# Patient Record
Sex: Male | Born: 1945 | Race: White | Hispanic: No | Marital: Married | Smoking: Former smoker
Health system: Southern US, Community
[De-identification: ages and names within clinical notes are randomized; demographics above are authoritative.]

## PROBLEM LIST (undated history)

## (undated) DIAGNOSIS — F329 Major depressive disorder, single episode, unspecified: Secondary | ICD-10-CM

## (undated) DIAGNOSIS — N400 Enlarged prostate without lower urinary tract symptoms: Secondary | ICD-10-CM

## (undated) DIAGNOSIS — M1990 Unspecified osteoarthritis, unspecified site: Secondary | ICD-10-CM

## (undated) DIAGNOSIS — E119 Type 2 diabetes mellitus without complications: Secondary | ICD-10-CM

## (undated) DIAGNOSIS — I10 Essential (primary) hypertension: Secondary | ICD-10-CM

## (undated) DIAGNOSIS — E785 Hyperlipidemia, unspecified: Secondary | ICD-10-CM

## (undated) DIAGNOSIS — G4733 Obstructive sleep apnea (adult) (pediatric): Secondary | ICD-10-CM

## (undated) DIAGNOSIS — I509 Heart failure, unspecified: Secondary | ICD-10-CM

## (undated) DIAGNOSIS — F419 Anxiety disorder, unspecified: Secondary | ICD-10-CM

## (undated) HISTORY — DX: Unspecified osteoarthritis, unspecified site: M19.90

## (undated) HISTORY — DX: Obstructive sleep apnea (adult) (pediatric): G47.33

## (undated) HISTORY — DX: Essential (primary) hypertension: I10

## (undated) HISTORY — DX: Heart failure, unspecified: I50.9

## (undated) HISTORY — PX: OTHER SURGICAL HISTORY: SHX169

## (undated) HISTORY — DX: Major depressive disorder, single episode, unspecified: F32.9

## (undated) HISTORY — DX: Anxiety disorder, unspecified: F41.9

## (undated) HISTORY — DX: Benign prostatic hyperplasia without lower urinary tract symptoms: N40.0

## (undated) HISTORY — DX: Hyperlipidemia, unspecified: E78.5

---

## 1993-03-23 HISTORY — PX: FOOT SURGERY: SHX648

## 1999-01-01 ENCOUNTER — Ambulatory Visit (HOSPITAL_COMMUNITY): Admission: RE | Admit: 1999-01-01 | Discharge: 1999-01-01 | Payer: Self-pay | Admitting: *Deleted

## 2000-03-12 ENCOUNTER — Ambulatory Visit (HOSPITAL_COMMUNITY): Admission: RE | Admit: 2000-03-12 | Discharge: 2000-03-12 | Payer: Self-pay | Admitting: Specialist

## 2000-03-12 ENCOUNTER — Encounter: Payer: Self-pay | Admitting: Specialist

## 2000-03-23 HISTORY — PX: SPINE SURGERY: SHX786

## 2000-03-23 HISTORY — PX: OTHER SURGICAL HISTORY: SHX169

## 2000-03-26 ENCOUNTER — Ambulatory Visit (HOSPITAL_COMMUNITY): Admission: RE | Admit: 2000-03-26 | Discharge: 2000-03-26 | Payer: Self-pay | Admitting: Specialist

## 2000-03-26 ENCOUNTER — Encounter: Payer: Self-pay | Admitting: Specialist

## 2000-05-05 ENCOUNTER — Encounter: Payer: Self-pay | Admitting: Specialist

## 2000-05-12 ENCOUNTER — Inpatient Hospital Stay (HOSPITAL_COMMUNITY): Admission: RE | Admit: 2000-05-12 | Discharge: 2000-05-15 | Payer: Self-pay | Admitting: Specialist

## 2000-05-12 ENCOUNTER — Encounter (INDEPENDENT_AMBULATORY_CARE_PROVIDER_SITE_OTHER): Payer: Self-pay | Admitting: Specialist

## 2000-05-12 ENCOUNTER — Encounter: Payer: Self-pay | Admitting: Specialist

## 2000-12-29 ENCOUNTER — Emergency Department (HOSPITAL_COMMUNITY): Admission: EM | Admit: 2000-12-29 | Discharge: 2000-12-29 | Payer: Self-pay | Admitting: Emergency Medicine

## 2000-12-29 ENCOUNTER — Encounter: Payer: Self-pay | Admitting: Emergency Medicine

## 2001-01-11 ENCOUNTER — Ambulatory Visit (HOSPITAL_BASED_OUTPATIENT_CLINIC_OR_DEPARTMENT_OTHER): Admission: RE | Admit: 2001-01-11 | Discharge: 2001-01-11 | Payer: Self-pay | Admitting: Orthopaedic Surgery

## 2001-03-21 ENCOUNTER — Encounter: Admission: RE | Admit: 2001-03-21 | Discharge: 2001-03-21 | Payer: Self-pay | Admitting: Specialist

## 2001-03-21 ENCOUNTER — Encounter: Payer: Self-pay | Admitting: Specialist

## 2001-05-11 ENCOUNTER — Encounter: Admission: RE | Admit: 2001-05-11 | Discharge: 2001-05-11 | Payer: Self-pay | Admitting: Urology

## 2001-05-11 ENCOUNTER — Encounter: Payer: Self-pay | Admitting: Orthopaedic Surgery

## 2001-05-13 ENCOUNTER — Ambulatory Visit (HOSPITAL_COMMUNITY): Admission: RE | Admit: 2001-05-13 | Discharge: 2001-05-13 | Payer: Self-pay | Admitting: *Deleted

## 2001-06-24 ENCOUNTER — Encounter: Payer: Self-pay | Admitting: Orthopaedic Surgery

## 2001-06-29 ENCOUNTER — Encounter: Payer: Self-pay | Admitting: Orthopaedic Surgery

## 2001-06-29 ENCOUNTER — Inpatient Hospital Stay (HOSPITAL_COMMUNITY): Admission: RE | Admit: 2001-06-29 | Discharge: 2001-07-04 | Payer: Self-pay | Admitting: Orthopaedic Surgery

## 2001-07-04 ENCOUNTER — Encounter: Payer: Self-pay | Admitting: Orthopaedic Surgery

## 2001-08-10 ENCOUNTER — Ambulatory Visit (HOSPITAL_COMMUNITY): Admission: RE | Admit: 2001-08-10 | Discharge: 2001-08-10 | Payer: Self-pay | Admitting: *Deleted

## 2003-03-24 DIAGNOSIS — F32A Depression, unspecified: Secondary | ICD-10-CM

## 2003-03-24 HISTORY — DX: Depression, unspecified: F32.A

## 2005-03-18 ENCOUNTER — Ambulatory Visit: Payer: Self-pay | Admitting: Internal Medicine

## 2005-04-03 ENCOUNTER — Ambulatory Visit (HOSPITAL_BASED_OUTPATIENT_CLINIC_OR_DEPARTMENT_OTHER): Admission: RE | Admit: 2005-04-03 | Discharge: 2005-04-03 | Payer: Self-pay | Admitting: Family Medicine

## 2005-04-12 ENCOUNTER — Ambulatory Visit: Payer: Self-pay | Admitting: Internal Medicine

## 2005-05-29 ENCOUNTER — Ambulatory Visit (HOSPITAL_BASED_OUTPATIENT_CLINIC_OR_DEPARTMENT_OTHER): Admission: RE | Admit: 2005-05-29 | Discharge: 2005-05-29 | Payer: Self-pay | Admitting: Family Medicine

## 2005-05-31 ENCOUNTER — Ambulatory Visit: Payer: Self-pay | Admitting: Internal Medicine

## 2006-11-09 ENCOUNTER — Ambulatory Visit: Payer: Self-pay | Admitting: Internal Medicine

## 2007-02-23 ENCOUNTER — Inpatient Hospital Stay (HOSPITAL_COMMUNITY): Admission: RE | Admit: 2007-02-23 | Discharge: 2007-02-27 | Payer: Self-pay | Admitting: Specialist

## 2008-06-25 ENCOUNTER — Ambulatory Visit: Payer: Self-pay | Admitting: Diagnostic Radiology

## 2008-06-25 ENCOUNTER — Inpatient Hospital Stay (HOSPITAL_COMMUNITY): Admission: EM | Admit: 2008-06-25 | Discharge: 2008-06-28 | Payer: Self-pay | Admitting: Internal Medicine

## 2008-06-25 ENCOUNTER — Encounter: Payer: Self-pay | Admitting: Emergency Medicine

## 2010-07-02 LAB — COMPREHENSIVE METABOLIC PANEL
AST: 24 U/L (ref 0–37)
Albumin: 3.6 g/dL (ref 3.5–5.2)
Chloride: 103 mEq/L (ref 96–112)
Creatinine, Ser: 1.1 mg/dL (ref 0.4–1.5)
GFR calc Af Amer: >60 mL/min (ref >60)
Potassium: 3.9 mEq/L (ref 3.5–5.1)
Total Bilirubin: 0.5 mg/dL (ref 0.3–1.2)
Total Protein: 7.2 g/dL (ref 6.0–8.3)

## 2010-07-02 LAB — POCT CARDIAC MARKERS
CKMB, poc: <1 ng/mL — ABNORMAL LOW (ref 1.0–8.0)
CKMB, poc: <1 ng/mL — ABNORMAL LOW (ref 1.0–8.0)
Myoglobin, poc: 75.4 ng/mL (ref 12–200)
Myoglobin, poc: 87.5 ng/mL (ref 12–200)
Troponin i, poc: <0.05 ng/mL (ref 0.00–0.09)

## 2010-07-02 LAB — CBC
Hemoglobin: 12.3 g/dL — ABNORMAL LOW (ref 13.0–17.0)
MCHC: 34 g/dL (ref 30.0–36.0)
MCV: 91.6 fL (ref 78.0–100.0)
Platelets: 122 10*3/uL — ABNORMAL LOW (ref 150–400)
RBC: 3.9 MIL/uL — ABNORMAL LOW (ref 4.22–5.81)
RDW: 14.4 % (ref 11.5–15.5)
RDW: 13.5 % (ref 11.5–15.5)
WBC: 7 10*3/uL (ref 4.0–10.5)
WBC: 5.7 10*3/uL (ref 4.0–10.5)

## 2010-07-02 LAB — CULTURE, BLOOD (ROUTINE X 2): Culture: NO GROWTH

## 2010-07-02 LAB — URINALYSIS, ROUTINE W REFLEX MICROSCOPIC
Glucose, UA: NEGATIVE mg/dL
pH: 6 (ref 5.0–8.0)

## 2010-07-02 LAB — POCT I-STAT 3, ART BLOOD GAS (G3+)
Bicarbonate: 25 mEq/L — ABNORMAL HIGH (ref 20.0–24.0)
O2 Saturation: 94 %
Patient temperature: 37
TCO2: 26 mmol/L (ref 0–100)
pH, Arterial: 7.398 (ref 7.350–7.450)

## 2010-07-02 LAB — LIPID PANEL
HDL: 35 mg/dL — ABNORMAL LOW (ref >39)
Triglycerides: 35 mg/dL (ref <150)
VLDL: 7 mg/dL (ref 0–40)

## 2010-07-02 LAB — BASIC METABOLIC PANEL
CO2: 27 mEq/L (ref 19–32)
Calcium: 8.3 mg/dL — ABNORMAL LOW (ref 8.4–10.5)
Calcium: 8.9 mg/dL (ref 8.4–10.5)
Chloride: 105 mEq/L (ref 96–112)
Creatinine, Ser: 1.17 mg/dL (ref 0.4–1.5)
GFR calc Af Amer: >60 mL/min (ref >60)
GFR calc non Af Amer: >60 mL/min (ref >60)
Glucose, Bld: 173 mg/dL — ABNORMAL HIGH (ref 70–99)
Sodium: 143 mEq/L (ref 135–145)

## 2010-07-02 LAB — DIFFERENTIAL
Basophils Absolute: 0 10*3/uL (ref 0.0–0.1)
Eosinophils Relative: 3 % (ref 0–5)
Lymphocytes Relative: 19 % (ref 12–46)
Monocytes Absolute: 0.8 10*3/uL (ref 0.1–1.0)
Monocytes Relative: 15 % — ABNORMAL HIGH (ref 3–12)

## 2010-07-02 LAB — URINE MICROSCOPIC-ADD ON

## 2010-07-02 LAB — PROTIME-INR: INR: 1.1 (ref 0.00–1.49)

## 2010-08-05 NOTE — Op Note (Signed)
NAMEBRANTLY, KALMAN               ACCOUNT NO.:  [id number]   MEDICAL RECORD NO.:  [id number]          PATIENT TYPE:  AMB   LOCATION:  SDS                          FACILITY:  [facility]   PHYSICIAN:  Kerrin Champagne, M.D.   DATE OF BIRTH:  [birth year]   DATE OF PROCEDURE:  02/23/2007  DATE OF DISCHARGE:                               OPERATIVE REPORT   PREOPERATIVE DIAGNOSIS:  Left foot dorsal abscess.   POSTOPERATIVE DIAGNOSIS:  Left foot dorsal abscess with surrounding  cellulitis.   PROCEDURE:  Incision, drainage and debridement, left dorsal foot  abscess.   SURGEON:  Kerrin Champagne, M.D.   ASSISTANT:  None.   ANESTHESIA:  General via orotracheal intubation, Kaylyn Layer. Michelle Piper, M.D.   FINDINGS:  Left foot dorsal abscess superficial to the tendon sheath  with areas of fat necrosis. The total diameter about 6 to 7 cm.   SPECIMENS:  Anaerobic and aerobic culture and sensitivity obtained.   ESTIMATED BLOOD LOSS:  15 to 20 mL.   COMPLICATIONS:  None.   The patient returned to the PACU in good condition.   HISTORY OF PRESENT ILLNESS:  A 65 year old male who has been  experiencing worsening swelling and discomfort over the left dorsal foot  over the past 3 days.  He is a Naval architect newly diagnosed with  diabetes by his primary care physician at [facility].  He was seen  at urgent care on Monday in [facility] and there the patient had  cultures performed, the results of which are not available.  He  underwent a very small incision and drainage of the dorsal aspect of his  foot, had worsening swelling and discomfort, presented to our office  today, February 23, 2007 and was found to have severely swollen  extremity, left dorsal foot, with areas of abscess over the dorsal  aspect of the foot and surrounding cellulitis.  He is brought to the  operating room to undergo a formal incision, drainage and debridement.   DESCRIPTION OF PROCEDURE:  After adequate general anesthesia, the  left  lower extremity was prepped with DuraPrep solution following removal of  wick from area from area of the abscess over the dorsal aspect, left  foot.  The prep was performed up to the mid calf level, draped in the  usual manner.  Initial incision measuring about 1 cm in length was  extended distally an additional 2 to 3 cm and then proximally an  additional 2 cm and this area was then opened.  Soft tissues were then  expressed and a great deal of abundant necrotic fat tissue was  expressed.  Cultures were obtained of the abscessed area.  Curettage was  then carried out using medium sized curets, debriding areas of dead or  necrotic subcutaneous layers.  The patient's extensor mechanism appeared  to be in good condition.  The abscess appeared to be superficial to the  extensor tendon sheath.  After adequate curettage, then irrigation was  carried out using 1,000 mL of normal saline, 1,000 mL of double  antibiotic solution after placing about 250 mL  of hydrogen peroxide  through the wound and then irrigating out with further normal saline.  Packing was then carried out using plain gauze packing spread out to a  length of about an inch.  Adaptic, 4 x 4's, ABD pad then applied, fixed  to the skin with Kerlix and then Coban applied.  Fascia was then  reactivated, extubated, returned to the recovery room in satisfactory  condition.  All instrument and sponge counts were correct.  Note that  the area of drainage was primarily over the dorsal aspect of the  forefoot at about the mid portion of the metatarsal, extending to the  tarsometatarsal junction.      Kerrin Champagne, M.D.  Electronically Signed     JEN/MEDQ  D:  02/23/2007  T:  02/23/2007  Job:  [id number]

## 2010-08-05 NOTE — Assessment & Plan Note (Signed)
[facility]                             PULMONARY OFFICE NOTE   [name], [name]                      MRN:          [id number]  DATE:11/09/2006                            DOB:          1945/10/21    SLEEP MEDICINE CONSULTATION   PROBLEM:  This is a 65 year old man with a long-standing history of  sleep apnea who comes now for a CPAP adjustment.   HISTORY:  Sleep apnea was diagnosed years ago.  Nasal congestion is a  persistent problem that interferes with CPAP comfort.  His nose is okay  when he is up in the daytime off of CPAP.  The CPAP pressure appears  comfortable set around 14 CWP through [facility].  We have NPSG report from  April 03, 2005 indicating an AHI of 50.3 per hour and a CPAP titration  study on May 29, 2005 to a CPAP pressure of 14 CWP for an AHI of 6.7  per hour.  He says he hacks, coughs, and sneezes when he gets up.  He is  told he occasionally snores through his mask, and that his legs twitch  some in his sleep.  Bed time is around 10 p.m. estimating 15 minute  sleep latency and waking 3 to 4 times before up at 6:30 a.m.   MEDICATIONS:  1. Aspirin 81 mg.  2. Doxazosin 8 mg.  3. Potassium 10 mg.  4. Nabumetone 750 mg x2 b.i.d.  5. Furosemide 40 mg.  6. Lipitor 20 mg.  7. CPAP.  He had had oxygen for sleep.  8. Symbix for depression.   No medication allergy.   REVIEW OF SYSTEMS:  Snoring.  Daytime sleepiness if inactive.  Leg and  body jerks while sleeping.  Dozing off in the evening.  Depression.   PAST HISTORY:  Seasonal rhinitis with no ENT surgery.  No lung disease.  Hypertension with no heart disease.  Sleep apnea.  Previous spine  surgery.   SOCIAL HISTORY:  Never smoked.  Quit drinking 20 years ago.  Married.  No children.  Married.  He is a Naval architect.  He is adopted.   FAMILY HISTORY:  Unknown because of adoption.   OBJECTIVE:  Weight 356 pounds, BP 130/78, pulse 56, room air saturation  98%.  This  is an obese man, alert.  NEUROLOGIC:  Unremarkable to observation.  HEENT:  Palate spacing 3/4 with short, thick neck.  No thyromegaly or  stridor.  Neck veins are not prominent.  Turbinates are edematous.  There is significant bilateral periorbital edema.  HEART:  Sounds are regular without murmur.  LUNGS:  The lung fields are clear.  EXTREMITIES:  Without edema or cyanosis.   IMPRESSION:  1. Obstructive sleep apnea recently titrated to suggested pressure of      14 cm water pressure compared with previous unknown pressure.  2. Rhinitis.  3. Some leg jerks at night, which his wife regards as a minor problem.   PLAN:  1. CPAP at 14 CWP.  2. Sample Patanase nasal spray once each nostril b.i.d. p.r.n.  3. Sleep hygiene, weight loss,  and safe driving.  4. [employer] is going to work on his mask fit, which is currently his      biggest complaint.  5. Schedule return in 1 month, earlier p.r.n.     Clinton D. Maple Hudson, MD, Tonny Bollman, FACP  Electronically Signed    CDY/MedQ  DD: 11/09/2006  DT: 11/10/2006  Job #: [id number]   cc:   Tanya D. Daphine Deutscher, M.D.

## 2010-08-05 NOTE — Discharge Summary (Signed)
[name], [name]               ACCOUNT NO.:  [id number]   MEDICAL RECORD NO.:  [id number]          PATIENT TYPE:  INP   LOCATION:  3027                         FACILITY:  [facility]   PHYSICIAN:  Gardiner Barefoot, MD    DATE OF BIRTH:  09-May-1945   DATE OF ADMISSION:  06/25/2008  DATE OF DISCHARGE:                               DISCHARGE SUMMARY   PRIMARY CARE PHYSICIAN:  Tanya D. Daphine Deutscher, MD   HISTORY OF PRESENT ILLNESS:  The patient is a 65 year old male with a  history of obstructive sleep apnea and long smoking history presented  with what was described as a nonproductive cough and shortness of breath  for 5 days prior to the admission.  The patient was initially evaluated  for PE and was noted to not have PE, but left lower lobe pneumonia on a  chest x-ray and a CT angio.  He was subsequently admitted for IV therapy  and started on community-acquired therapy.   DISCHARGE DIAGNOSES:  1. Community-acquired pneumonia, improving.  2. Benign prostatic hypertrophy.  3. Obstructive sleep apnea on CPAP.  4. Spondylosis with history of multiple laminectomies and on chronic      pain medications.  5. Morbid obesity.  6. History of tobacco abuse.  7. History of alcohol abuse.  8. Hyperlipidemia.  9. Hypertension.  10.Unclear of history of congestive heart failure, however, BNP was      within normal limits.   MEDICATIONS AT DISCHARGE:  1. Levaquin 750 mg p.o. daily for 3 more days through June 29, 2008.  2. Doxazosin 8 mg p.o. b.i.d.  3. Lasix 40 mg p.o. b.i.d.  4. Lipitor 20 mg p.o. daily.  5. Potassium 10 mEq p.o. daily.  6. Spironolactone 50 mg p.o. daily.  7. Multivitamin daily.  8. Nabumetone 750 mg p.o. b.i.d.   HOSPITAL COURSE:  1. Community-acquired pneumonia.  The patient was started on therapy      with ceftriaxone and azithromycin for presumed community-acquired      pneumonia.  After talking to the patient, it was determined to be a      very productive cough with a  clear white sputum, consistent with      pneumonia.  The patient did not have any fever, however, and his      white count remained normal.  Also the patient initially had      reported some shortness of breath; however, this was resolved at      discharge.  The patient was on oxygen therapy during his      hospitalization, however, had good O2 sats on room air prior to      discharge.  The patient was instructed to call his doctor or return      to emergency room if he develops any fever or worsening, any new      shortness of breath that is not consistent with his baseline with      his obstructive sleep apnea.  The patient felt he was stable to go      home and was at his baseline other than  the continued productive      cough.  The patient was eager to home and was felt stable at      discharge with no hypoxia or any acute symptoms requiring      hospitalization.  The patient will continue with Levaquin at a high      dose of 750 mg to complete 3 more days of therapy after discharge      for his community-acquired pneumonia.  He is also instructed to see      his primary care physician by the end of this week to ensure that      he is improving.  2. Obstructive sleep apnea.  The patient was continued on BiPAP.  3. CHF.  There is some report of CHF in history, however his BNP was      within normal limits, there is no echo known to have been done in      the past.  He is, however, on Lasix therefore may benefit      outpatient echocardiogram to evaluate his cardiac function, if it      has not been done previously.  4. Hypertension.  This has remained stable.  5. Hypotension.  The patient did have 1 episode of hypotension during      his hospitalization, however, this was responded to small dose of      IV therapy.  This is likely secondary to his pain medications.  He      had no further episodes more than 24 hours prior to discharge.  6. Hyperlipidemia.  His lipid panel was checked  during his      hospitalization and his LDL was 81 with HDL of 35 and triglycerides      of 35 and total cholesterol of 123.  Therefore his Lipitor dose was      not changed and is stable.   DISCHARGE:  The patient was discharged in stable condition.      Gardiner Barefoot, MD  Electronically Signed     RWC/MEDQ  D:  06/27/2008  T:  06/28/2008  Job:  [id number]   cc:   Tanya D. Daphine Deutscher, M.D.

## 2010-08-05 NOTE — H&P (Signed)
NAMEJUDY, [name]               ACCOUNT NO.:  [id number]   MEDICAL RECORD NO.:  [id number]          PATIENT TYPE:  INP   LOCATION:  3027                         FACILITY:  [facility]   PHYSICIAN:  Renee Ramus, MD       DATE OF BIRTH:  1945/12/13   DATE OF ADMISSION:  06/25/2008  DATE OF DISCHARGE:  06/25/2008                              HISTORY & PHYSICAL   PRIMARY CARE PHYSICIAN:  Tanya D. Daphine Deutscher, M.D.   CHIEF COMPLAINT:  The patient comes in with a chief complaint of  shortness of breath and a nonproductive cough.   HISTORY OF THE PRESENT ILLNESS:  The patient is a pleasant 65 year old  male with a past medical history of hypertension, hyperlipidemia,  questionable CHF, BPH, sleep apnea, and a history of multilevel lumbar  and cervical spondylosis status post multiple laminectomies who came in  to the emergency department complaining of a dry cough and shortness of  breath for the last 5 days prior to admission.  The patient reports that  the cough and shortness of breath had been associated with chills and  subjective fever.  He denies chest pain, abdominal pain, nausea,  vomiting, diarrhea, and/or constipation.  He endorses mild-to-moderate  throat pain; and, reports good compliance with his medications and CPAP  machine at home.  While in the emergency department the patient had been  complaining of shortness of breath.  The patient had a D-dimer, which  was elevated at 1.46 and received a CT angio of the chest, which was  negative for pulmonary embolism, but positive for left lower lobe  opacities consistent with community-acquired pneumonia, findings that  correlated with left basilar airspace disease seen on the chest x-ray  performed on the same day.   PAST MEDICAL HISTORY:  The past medical history is as described above.   SOCIAL HISTORY:  The patient is a former smoker, smoking about 1-2 packs  of cigarettes per day for about 40-50 years.  He is also a former  drinker  of beer and liquor, but denies any illicit drugs or intravenous  drugs.  The patient is a Naval architect.  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   MEDICATIONS:  1. Doxazosin 8 mg by mouth twice a day.  2. Lasix 40 mg twice a day.  3. Lipitor 20 mg daily.  4. Potassium 10 mEq daily.  5. Spironolactone 50 mg daily.  6. Multivitamin 1 tablet by mouth daily.  7. Nabumetone 750 mg twice a day.   REVIEW OF SYSTEMS:  Regarding the review of systems a 12-point review of  systems are checked and are all negative, except for the findings on his  physical exam and in the history of present illness.   FAMILY HISTORY:  The family history is unknown since the patient was  adopted.   PHYSICAL EXAMINATION:  VITAL SIGNS: Blood pressure 130/61, heart rate  77, respiratory rate 24, temperature 98.3, and oxygen saturation 100% on  room air.  GENERAL APPEARANCE:  In general the patient is in no acute distress,  breathing comfortably, lying in bed with scattered cough  throughout  the whole exam.  HEENT:  The head, eyes, ears, nose, and throat reveals mild erythema of  the throat and dry mucous membranes.  LUNGS:  The lungs are clear to auscultation bilaterally.  No crackles.  No wheezing.  HEART:  The heart has a regular rate and rhythm.  No murmurs, gallops or  rubs.  ABDOMEN:  The abdomen is soft, nontender and nondistended with positive  bowel sounds.  He has an obese and protuberant abdomen.  EXTREMITIES:  The extremities reveal 1+ edema bilaterally with excessive  dermatitis present on the frontal area of both legs.  NEUROLOGIC:  On neurological exam the patient is alert, awake and  oriented x3.  Cranial nerves II-XII are intact.  Muscle strength is 5/5.  No focal deficits.   LABORATORY DATA:  Sodium 143, potassium 3.9, chloride 103, bicarb 31,  BUN 16, creatinine 1.1, and blood sugar 96.  Liver function tests were  completely normal.  The patient had a BNP of 14.  White blood cells 5.7,  hemoglobin 12.9  and platelets 142,000.  D-dimer was 1.46.  Cardiac  markers were negative x2.  The patient had an ABG with a pH of 7.398,  CO2 40.5, O2 71 and bicarb 25.  Chest x-ray demonstrated left basilar  airspace disease and CT angio of the chest was positive for a left lower  lobe opacity.   ASSESSMENT AND PLAN:  1. Left lower lobe opacity.  This is most likely to be atypical      pneumonia, community-acquired.  We will provide treatment with      Rocephin and azithromycin intravenously, and we will also add      Robitussin for his cough.  We will review the patient's vital signs      and respiratory status in the morning; and, if he is feeling better      and there no abnormal changes identified, we will discharge him on      by mouth antibiotics to complete 1 week of treatment.  2. Hypertension, well controlled.  We will continue his home regimen.  3. Questionable congestive heart failure exacerbation.  We will check      a thyroid stimulating hormone.  The patient right now will be      continued on his home medications that at this point as are      controlling his hypertension, and he will need a formal two-      dimensional echocardiogram in order to evaluate if he really has or      does not have congestive heart failure.  With a Beta-natriuretic      peptide of 14 it is unlikely this is a confirmed diagnosis.  4. Hyperlipidemia.  We are going to check a fasting lipid profile in      the morning.  We will continue Lipitor 20 mg by mouth daily and we      will adjust the dose as needed.  5. Sleep apnea.  The patient is tolerating well his continuous      positive airway pressure with good compliance every night.  We are      going to continue to provide him with a continuous positive airway      pressure machine at the patient's home setting, which is CWP of 14.  6. Benign prostatic hypertrophy.  At this point this is stable.  We      will continue his Doxazosin.  7. Chronic back pain.  We will continue his home regimen, since his      pain is well-controlled, with his nabumetone 750 mg twice a day.  8. Prophylaxes.  For prophylaxes the patient will receive heparin and      Protonix.      Rosanna Randy, MD  Electronically Signed      Renee Ramus, MD  Electronically Signed    CEM/MEDQ  D:  06/25/2008  T:  06/26/2008  Job:  [id number]

## 2010-08-08 NOTE — H&P (Signed)
[facility]  Patient:    [name], [name]                        MRN: [id number] Adm. Date:  05/12/00 Attending:  Javier Docker, M.D. Dictator:   Dorie Rank, P.A.C. CC:         Anna Genre. Little, M.D.   History and Physical  DATE OF BIRTH:  [birth year]  CHIEF COMPLAINT:  Low back pain.  HISTORY OF PRESENT ILLNESS:  While on the job on February 19, 2000, the patient was chasing a tire and injured his back.  Since then, he has had increasing pain to the low back with severe pain bilaterally with any type of extension, walking, or standing.  States it is somewhat relieved with sitting. His pain radiates to the right leg, but to the left as well.  The patient has been put on nonsteroidals and Vicodin ES which only minimally helped the pain.   PHYSICAL EXAMINATION:  On physical examination the patient appears in moderate distress.  The straight leg raise on the right produces buttock and posterior thigh pain, on the left buttock pain.  Slightly decreased sensation at the plantar aspect of the right foot.  No Babinski or clonus.  Positive dorsalis pedis pulse bilaterally.  Muscle strength:  Quadriceps is 5/5, hamstrings is 5/5.  EHL on the right is 5/5.  Tenderness on palpation to the lumbosacral junction.  Decreased range of motion on the left side flexion and extension. No flank pain on percussion.  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  Osteoarthritis to the SI joints.  MRI of the lumbar spine on March 29, 2000, revealed severe stenosis at L2-L3, L3-4, and L4-L5.  Central disk herniation at L5-S1, moderate stenosis.  No compression noted at that level, however.  He has a benign pedicle lesion at L5 diagnostic of a benign hemangioma.  [facility]el[facility] at [facility]  in December 2001, revealed a small posterolateral disk herniation at L4-5 on the right, as well as at L3-4.  Again, associated marked stenosis at L2 through 3, L3 through 4, and L4 through 5, most marked at L3-4 and L4-5.  The pain is to a point to where it is interfering with the patients activities of daily living, and is interfering with his sleep as well.  He is at the point where he would like to get something done.  It was felt at this time he would benefit from undergoing a lumbar decompression at L2-3, L3-4, and L4-5 with possible L5-S1.  Relevant anatomy and risks and benefits were discussed with the patient, as well as the procedure.  At that point he elected to proceed. He obtained medical clearance from Dr. Catha Gosselin at [facility].  PAST MEDICAL HISTORY: 1. Spinal stenosis at L2-3, L3-4, and L4-5, central herniated nucleus pulposus    at L5-S1. 2. Hypertension. 3. Osteoarthritis. 4. Benign prostatic hypertrophy. 5. Depression. 6. History of obesity, has lost over 100 pounds in the last year under the    direction of his doctor, taking Zenical.  PAST SURGICAL HISTORY: 1. Pilonidal cystectomy in 1968. 2. Redo of pilonidal cyst removal in 1970. 3. Surgery on the subtalar joint, right foot, in 1992.  MEDICATIONS: 1. Zenical 120 mg one p.o. b.i.d. 2. Accupril 40 mg one p.o. q.d.  3. Doxazosin 4 mg one p.o. q.d. 4. Ambien 10 mg one p.o. q.h.s. p.r.n. sleep. 5. Vicodin ES one or two p.o. q.4-6h. p.r.n. pain.  ALLERGIES:  No known drug allergies.  SOCIAL HISTORY:  The patient is married, has one child.  He works for a [employer].  He lives in a one level home.  He did not donate any blood for this procedure.  He is a nonsmoker and denies any alcohol use.  FAMILY HISTORY:  Noncontributory.  The patient is adopted.  REVIEW OF SYSTEMS:  GENERAL:  No fevers, chills, night sweats, or bleeding tendencies.  NEUROLOGIC:  No blurred or double vision,  seizures, headache, paralysis.  RESPIRATORY:  No shortness of breath, productive cough, or hemoptysis.  CARDIOVASCULAR:  No chest pain and/or orthopnea.  GASTROINTESTINAL:  No nausea, vomiting, diarrhea, constipation, melena, or bloody stools.  GASTROURINARY:  No dysuria, hematuria, or discharge.  MUSCULOSKELETAL:  History of osteoarthritis of bilateral hips, spinal stenosis as described above in the history of present illness.  PHYSICAL EXAMINATION:  GENERAL:  A 65 year old white male with somewhat of a flat affect on exam.  VITAL SIGNS:  Pulse 90, respirations 16, blood pressure 120/70.  NECK:  Supple, negative for carotid bruits bilaterally.  CHEST:  Lungs are clear to auscultation bilaterally.  No wheezes, rhonchi, or rales.  BREASTS:  Not pertinent to present illness.  HEART:  S1 and S2.  Negative for murmurs, rubs, or gallops.  Heart is regular in rate and rhythm.  ABDOMEN:  Soft, nontender, positive bowel sounds.  GASTROURINARY:  Not pertinent to present illness.  EXTREMITIES:  There is 2+ dorsalis pedis pulses bilaterally, 2+ posterior tibialis pulses bilaterally.  Please see history of present illness for full description of physical examination of the L-spine.  SKIN:  Acyanotic.  No erythema or lesions noted.  NEUROLOGIC:  Extraocular movements intact.  Pupils are equal, round and reactive to light and accommodation.  IMPRESSION: 1. Spinal stenosis, L2-3, L3-4, and L4-5.  Central herniated nucleus pulposus    at L5-S1. 2. Hypertension. 3. Osteoarthritis. 4. Benign prostatic hypertrophy.  PLAN:  The patient is scheduled for lumbar decompression of L2-3, L3-4, L4-5, and possible L5-S1 with Dr. Jene Every on May 12, 2000 at [facility]. DD:  05/05/00 TD:  05/06/00 Job: [id number]

## 2010-08-08 NOTE — Discharge Summary (Signed)
[facility]  Patient:    [name], [name]                      MRN: [id number] Adm. Date:  [id number] Disc. Date: [id number] Attending:  Pierce Crane Dictator:   Alexzandrew L. Perkins, P.A.-C.                           Discharge Summary  ADMISSION DIAGNOSES: 1. Spinal stenosis L2-3, L3-4, L4-5, and central herniated nucleus pulposus at    L5-S1. 2. Hypertension. 3. Osteoarthritis. 4. Benign prostatic hypertrophy.  DISCHARGE DIAGNOSES: 1. 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  Status post lumbar decompression centrally of    L2-3, L3-4, and L4-5.  Complete laminectomies at L3 and L4, with partial    laminectomies at L2 and L5, microdiskectomy at L4-5, foraminotomies at L3,    L4, and L5. 2. Hypertension. 3. Osteoarthritis. 4. Benign prostatic hypertrophy.  PROCEDURES:  The patient was taken to the OR on May 12, 2000, and underwent lumbar central decompression of L2-3, L3-4, and L4-5.  Also underwent complete laminectomies at L3 and L4, with partial laminectomies of L2 and L5, microdiskectomy at L4-5 with foraminotomies at L3, L4, and L5. Surgeon:  Dr. Jene Every.  Assistant:  Dr. Vira Browns.  Anesthesia: General.  HISTORY OF PRESENT ILLNESS:  The patient is a 65 year old male with congenital stenosis an lower extremity neurogenic claudication, a work-related injury producing herniated nucleus pulposus at L3 and L4, placing the patient at neural compression.  The patient has been treated conservatively in the past, however, has failed conservative measures.  It was felt he would benefit from undergoing surgical intervention.  The risks and benefits of the procedure were discussed, and he elected to proceed with surgery.  LABORATORY DATA:  H&H on admission showed a hemoglobin of 14.2, hematocrit of 41.7.  Postoperative hemoglobin and hematocrit 10.9 and 32.2.  Last  noted H&H was 10.5 and 31.3.  BMET preoperatively all within normal limits with the exception of mildly elevated glucose at 128.  Serial BMETs were followed through the hospital course.  Glucose came back down and was 112 prior to discharge.  Chloride did elevate from 109 to 113, however, was back down to 111.  Urinalysis on admission negative except mildly elevated urobilinogen of 1.0.  Blood group type O positive.  Chest x-ray dated May 05, 2000:  No evidence for acute cardiopulmonary disease.  Intraoperative lumbar films taken on May 12, 2000, showed localization instruments at L2-3 and at L5-S1.  HOSPITAL COURSE:  The patient was admitted to [facility] and taken to the OR on May 12, 2000, and underwent the above-stated procedure without complication.  The patient tolerated the procedure well and was later transferred to the recovery room and then to the orthopedic floor for continued postoperative care.  Vital signs were followed.  Drain was pulled out on postoperative day #1.  Physical therapy and occupational therapy were consulted postoperatively to assist with gait training ambulation.  He was placed on IV analgesics for pain control with p.o. analgesics also given, 48 hours of antibiotics, and muscle relaxers as needed.  Started on an incentive spirometer due to elevation of temperature postoperatively.  He did have a postoperative temperature on postoperative day #1 of 101.5, responded well to the incentive spirometer and antipruritics.  Temperature was back down  the following day.  By postoperative day #2 the patient was moving his bowels and voiding his urine without difficulty.  Continued to encourage mobility.  By postoperative day #3 the patient was doing quite well.  Still had some mild to moderate low back pain and leg pain.  However, this was controlled with p.o. analgesics.  The patient was up ambulating.  It was decided the patient could be  discharged home.  DISCHARGE PLAN:  The patient was discharged home on May 15, 2000.  DISCHARGE DIAGNOSES:  Please see above.  DISCHARGE MEDICATIONS:  Vicodin p.r.n. pain.  Robaxin p.r.n. spasm. Medications were called in to [facility] at [facility].  ACTIVITY:  Up as tolerated.  Back precautions.  May start showering five to six days after surgery.  FOLLOW-UP:  In two weeks from date of surgery.  Call the office for an appointment at [phone].  DISPOSITION:  Home.  CONDITION ON DISCHARGE:  Improved.  DIET:  As tolerated. DD:  06/04/00 TD:  06/05/00 Job: [id number]

## 2010-08-08 NOTE — H&P (Signed)
[place]. [facility]  Patient:    [name], [name] Visit Number: [id number] MRN: [id number]          Service Type: END Location: ENDO Attending Physician:  Mingo Amber Dictated by:   Dorie Rank, P.A. Admit Date:  05/13/2001 Discharge Date: 05/13/2001   CC:         [facility]  Roselind [name], M.D., [facility], [facility]   History and Physical  DATE OF BIRTH:  Sep 07, 1945  CHIEF COMPLAINT:  Neck pain.  HISTORY OF PRESENT ILLNESS:  [name] is a pleasant 65 year old male who has had continued neck pain with radiation to his bilateral upper extremities, with numbness and tingling in his hands for quite some time now and it is to a point to where it is interfering with his activities of daily living and has been refractory to conservative treatment.  He is to the point where he is dropping small objects, unable to open door knobs.  He reports difficulty with buttons and zippers; at this point, his wife is actually dressing him.  He also reports crushing paper cups.  On physical exam, it was noted in the office on May 19, 2001 that he had poor balance, broad-based gait, positive Romberg sign.  The motor examination was 4/5 to the upper extremities globally.  Reflexes were 2+ in the right upper and lower extremities.  Left side reflexes are 3+ in the upper and lower extremities.  He had a positive Hoffmanns sign bilaterally.  He had a positive inverted radial reflex sign on the left.  Scapulohumeral reflex was negative bilaterally.  He had pain in his shoulders with extension to his neck.  He had approximately 50% of normal motion.  He had a CT myelogram which was compared to an MRI scan which revealed C2 through 3, no significant stenosis, although there was a mild disk bulge; at C3 through 4, severe central stenosis with chronic disk herniation and osteophyte, left greater  than right.  There was noted to be a flattening in the cord.  At C4 through 5, there was spondylitic changes with effacement of the subarachnoid space; at C5 through 6, spondylitic changes with severe spinal stenosis and flattening of the cord.  At C6 through 7, he had spondylitic changes again that contributed towards his central stenosis. There was also bilateral neural foraminal stenosis, left greater than right. It was felt that due the diagnostic studies and interference with his activities of daily living, he would benefit from undergoing a cervical fusion and laminectomies, C3 through 7.  The risks and benefits as well as the procedure were discussed with the patient; he elected to proceed.  He did obtain medical clearance from his family medical doctor of [facility] at [facility]; he also obtained a psychiatric clearance from [name].  ALLERGIES:  No known drug allergies.  MEDICATIONS:  1. Bayer Aspirin 81 mg one p.o. q.d. -- he will stop this seven days prior to     surgery.  2. Multivitamin one p.o. q.d.  3. Accupril 40 mg one p.o. q.a.m.  4. Hydrochlorothiazide 25 mg half a tab p.o. q.a.m.  5. Cardura 8 mg one p.o. q.h.s.  6. Celebrex 200 mg one p.o. b.i.d.  7. Robaxin 500 mg one p.o. q.8h. p.r.n. spasm.  8. Effexor XR 150 mg two p.o. q.h.s.  9. Risperdal 2 mg one p.o. q.h.s. 10. Neurontin 600 mg one  p.o. t.i.d. 11. Seroquel one p.o. q.h.s. 12. Protonix 40 mg one p.o. q.a.m. 13. Ferrous sulfate 325 mg one p.o. q.a.m.  PAST MEDICAL HISTORY:  Significant for severe anxiety and depression.  It is noted that patient states during his last hospital stay he had an anxiety-related seizure where he underwent cardiac workup and psychiatric evaluation; cardiac workup was negative, per patient.  He has a history of hypertension controlled on his current medications.  He has a history of BPH.  SOCIAL HISTORY:  The patient denies any tobacco or alcohol  use.  He is married and accompanied by his wife today.  He lives in a one-level home.  PAST SURGICAL HISTORY:  Lumbar decompression, February of 2002; bilateral carpal tunnel release, October of 2002; subtalar joint fusion to the right foot in the mid-1990s; surgery for a pilonidal cyst, 1968.  FAMILY HISTORY:  Unknown, patient is adopted.  REVIEW OF SYSTEMS:  GENERAL:  No fever, chills, night sweats or bleeding tendencies.  PULMONARY:  No shortness of breath, productive cough or hemoptysis.  CARDIOVASCULAR:  No chest pain, angina or orthopnea.  GU:  No hematuria, dysuria or discharge.  Frequent polyuria.  GI:  Constipation. Positive for melena in the past; he has seen a gastroenterologist and he had an evaluation for a possible ulcer, however, he states that this was thought to be secondary to NSAIDs.  NEUROLOGIC:  Positive for seizure in the hospital x1, no problem since then.  PSYCHIATRIC:  Positive for anxiety and depression as described up above in the past medical history.  PHYSICAL EXAMINATION:  GENERAL:  Flattened affect, 65 year old male accompanied by his wife today.  VITALS:  Pulse 70, respirations 16, blood pressure 130/80.  HEENT:  Head is atraumatic, normocephalic.  Oropharynx is clear.  There is a torus palatinus noted.  NECK:  Supple.  Negative for carotid bruits bilaterally.  Negative for cervical lymphadenopathy palpated on exam.  CHEST:  Clear to auscultation bilaterally.  No wheezes, rhonchi or rubs.  BREASTS:  Not pertinent to present illness.  HEART:  S1 and S2 negative for murmur, rub or gallop.  HEART:  Regular in rate and rhythm.  ABDOMEN:  Round, nontender.  Positive bowel sounds.  GU:  Not pertinent to present illness.  EXTREMITIES:  Please see history of present illness for physical exam to the neck and upper extremities.  SKIN:  Intact.  No rashes.  He does have multiple cherry hemangiomas to the anterior chest.   LABORATORY AND ACCESSORY  DATA:  Preop labs and x-rays are pending.  IMPRESSION:  1. Cervical spondylitic myeloradiculopathy.  2. Hypertension.  3. Benign prostatic hypertrophy.  4. Anxiety and depression.  PLAN:  Patient is scheduled for a posterior cervical laminectomy, C3 through 7, posterior cervical fusion, C3 through 7, with lateral mass screws and posterior iliac crest bone grafting. Dictated by:   Dorie Rank, P.A. Attending Physician:  Mingo Amber DD:  06/14/01 TD:  06/15/01 Job: [id number]

## 2010-08-08 NOTE — Discharge Summary (Signed)
[place]. [facility]  Patient:    [name], COSTER Visit Number: [id number] MRN: [id number]          Service Type: END Location: ENDO Attending Physician:  Mingo Amber Dictated by:   Ralene Bathe, P.A. Admit Date:  08/10/2001 Discharge Date: 08/10/2001                             Discharge Summary  ADMISSION DIAGNOSES:  1. Cervical spondylitic myeloradiculopathy.  2. History of seizure disorder.  3. History of depression with psychotic features.  4. Congenital spinal stenosis, status post multiple lumbar laminectomies.  5. Hypertension.  6. Anxiety/depression.  7. Benign prostatic hypertrophy.  8. Reflux.  9. Status post C3-7 posterior central laminectomy, lateral mass screws     and fusion. 10. Postoperative hemorrhagic anemia requiring transfusion without sequela.  OPERATIONS:  C3-7 posterior cervical laminectomy and lateral mass screws and fusion.  Surgeon Dr. Sharolyn Douglas, assisted by Dr. Otelia Sergeant, under general anesthesia with neurologic monitoring.  HISTORY:  This 65 year old male has had progressive history of cervical pain with radiation to upper extremities and severe numbness and tingling.  He has had difficulties with fine manipulation, left greater than right.  He has been unable to do zippers and buttons and has also had worsening incontinence of bowel and bladder.  He has weakness of the upper extremities and proximal muscle groups of his lower extremities.  He is hyperreflexic in the upper extremities, left greater than right, with a symmetric Hoffmann sign positive on the left, negative on the right, and global weakness in the upper extremities.  Also noted was proximal weakness in the lower extremities including the hip flexors.  He has noted multi-level cervical spondylosis with severe stenosis C3-7 and flattening of the cord with atrophy.  After a thorough discussion of the risks and benefits, posterior cervical  laminectomy and fusion was indicated in hopes of arresting progression of his myelopathy and improvement of his symptoms.  At this time, he wishes to proceed.  HOSPITAL COURSE:  The patient was admitted and underwent the above-named procedure and tolerated this well.  All appropriate IV antibiotics and analgesics were utilized.  Neurologic monitoring was utilized also intraoperatively.  Postoperatively, the patient did require 2 units transfusion also along with the Cell Saver for blood loss anemia.  The patient did well.  He did have a bit of oversedation with Benadryl analgesics in combination with his psychiatric medications.  These were minimized and he had significant improvement.  He began ambulating slowly.  He was able to void and tolerate p.o.  There were no Babinskis or clonus noted.  His grip strength is improved to 5/5 postoperatively.  Hemovac drain that was placed intraoperatively was removed.  By date July 04, 2001, he was afebrile, vital signs were stable and his incision was clean and dry.  He was voiding and had bowel movements and tolerating p.o.  Physical therapy and case management assisted with home needs and he was stable for discharge.  LABORATORY DATA:  Admission hemoglobin 13.9, down to 9.0 and then transfused. He had 10.6 prior to discharge.  Blood type O+.  Chemistries on admission within normal limits and postoperatively also normal.  Urinalysis negative on admission.  Two units transfused in the chart.  X-rays intraoperatively shows hardware placement from C3 inferiorly.  EKG showed unusual P axis, possible old inferior subendocardial injury from a marked ST abnormality.  This was on  April 4.  A repeat showed sinus rhythm with incomplete right bundle branch block.  CONDITION ON DISCHARGE:  Stable and improved.  DISCHARGE MEDICATIONS AND PLANS:  The patient is being discharged home.  He is to continue his home medications.  He is given Darvocet-N 100 one  every 4-6 p.r.n. pain and Protonix ______ mg 1 every day for his stomach.  No driving, no lifting greater than 5 pounds.  Keep incision covered, may shower.  He is in his Aspen collar for showering.  Collar on at all times.  Follow up in the middle of next week, call for time.  Resume other home medications and home diet. Dictated by:   Ralene Bathe, P.A. Attending Physician:  Mingo Amber DD:  08/19/01 TD:  08/22/01 Job: [id number]

## 2010-08-08 NOTE — Op Note (Signed)
[place]. [facility]  Patient:    [name], [name] Visit Number: [id number] MRN: [id number]          Service Type: SUR Location: 3000 [id number] Attending Physician:  Patricia Nettle Dictated by:   Patricia Nettle, M.D. Proc. Date: 06/29/01 Admit Date:  06/29/2001                             Operative Report  PREOPERATIVE DIAGNOSES: 1. Multilevel cervical spondylitic myeloradiculopathy. 2. History of seizure disorder. 3. History of depression with psychotic features. 4. Congenital spinal stenosis, status post multilevel lumbar    laminectomy.  PROCEDURES: 1. Posterior cervical laminectomy, C3 through C7. 2. Posterior cervical arthrodesis, C3-7. 3. Posterior cervical instrumentation utilizing lateral mask screws,    C3-7. 4. Local autogenous bone grafting, morcellized. 5. Allograft bone graft, morcellized. 6. Placement and removal of Mayfield tongs. 7. Neuro monitoring, utilizing SSEP continuous running EMG and triggered    EMG.  SURGEON:  Patricia Nettle, M.D.  ASSISTANT:  Fayrene Fearing _________, M.D.  COMPLICATIONS:  None.  ESTIMATED BLOOD LOSS:  1300 cc, 800 cc replaced with self saver.  INDICATIONS:  The patient is a 65 year old male who has a progressive history of cervical pain, with radiation into the upper extremities.  He has severe numbness and tingling, and is having difficulties with fine manipulation (left greater right).  Unable to do zippers and buttons, and in fact, his wife has to dress him.  He has also had worsening incontinence of bowel and bladder. He has weakness in his upper extremities, as well as in the proximal muscle groups of the lower extremities.  He is status post bilateral carpal tunnel releases.  On physical examination shows a hyperreflexia in the upper extremities, left greater than right.  He does have an asymmetric Hoffmans sign, positive on the left and negative on the right.  He has global weakness in the  upper extremities, left greater than right, as well as proximal weakness in the lower extremities; including the hip flexors.  He has difficult with rapidly alternating movement.  He does have a positive finger _________ sign.  Radiographs show multilevel cervical spondylosis with preservation of the lordosis.  He has had an MRI scan and CT myelogram showing severe stenosis at C3-4 through C6-7, with flattening of the cord and atrophy.  After a thorough discussion of the risks and benefits of posterior cervical laminectomy and fusion, the patient would like to proceed in hopes of arresting the progression of his myelopathy and hopefully improvement in his symptoms.  He is offered a second opinion, and he declined.  DESCRIPTION OF PROCEDURE:  The patient was taken to the operating room and underwent general endotracheal anesthesia without difficulty.  I personally held his neck in neutral during the intubation period.  Preoperatively he was able to extend neutral without pain.  At this point he was given prophylactic IV antibiotics.  Neuro monitoring was then established in the form of SSEP and upper extremity EMGs.  We obtained baseline signals.  I then placed the Mayfield tongs in the standard sterile fashion.  At this point we turned him prone onto the Ackermed four-poster positioning frame.  I controlled his neck at all times.  We were careful to be sure all bony prominences were padded, and that his face was clear.  I locked the Mayfield with the head in neutral. At this point we brought in fluoroscopy and  confirmed that the neck was in acceptable position.  Due to the size of his shoulder, we were not able to see below C3.  After positioning, we again obtained SSEP and EMG signals, and there was no change.  The posterior cervical area was prepped and draped in sterile fashion.  We then made an 8 cm incision from C2 to T1.  Dissection was carried down to the deep fascia.  We then  stayed within the nuchal ligament, carrying this avascular plane down the spinous processes.  The paraspinal muscles were then stripped in a periosteal fashion out to the edge of the lateral masses.  Care was taken to protect the attachments of the muscles at C2, as well as the C2-3 facet capsule and C7-T1 capsule.  After adequate exposure, we debrided the facet joints of all soft tissue, and used the high-speed bur to remove the inplate cartilage.  At this point, using the drill from the Summit system, we drilled our lateral mask screws at C3,4,5,6 and 7 bilaterally.  We drilled in a bicortical fashion at C3, and then unique cortical screws at 4,5,6 and 7.  We then measured our screw lengths and tapped all the holes.  We then placed bone wax in the holes.  At this point we performed a C3,4,5,6 and 7 laminectomy by first creating troughs bilaterally with the high-speed bur, followed by a 1 mm Kerrison.  We were then able to lift the lamina off enbloc, using curets to release the ligament of flavum. At no point was any pressure applied to the underlying spinal cord.  We controlled all epidural bleeding with bipolar electrocautery, Gelfoam and Surgicel.  At this point we then placed our screws that had been previously measured and tapped into the holes.  We utilized the [employer] Summit polyaxial system.  Appropriate rods were then cut and contoured, and locked into position using the caps.  On the right side at C6 we were unable to place the locking cap, because the rod would not sit down.  It was decided to leave this cap off, as this was an intervening segment and we had excellent purchase in all the screws.  We then decorticated the lateral masses using a high-speed bur.  We packed bone into the facet joints bilaterally, and then placed the remaining bone that had been harvested from the lamina and morcellized along the lateral masses.  We then took 10 cc of Allomatrix Allograft, and  packed this tightly on top of the autograft.  We then took an intraoperative lateral x-ray, and we were able to visualize nothing below C3.  We did confirm that  our lateral mask screws were in C3, but were unable to see the distal instrumentation.  It should be noted that after the screws were placed, we used triggered EMGs, and all readings were greater than 15 mA (confirming that the screws were completely within their osseous tunnels).  At this point we placed the deep Hemovac drain and then closed the fascia using multiple figure-of-eight #1 Vicryl sutures.  The subcutaneous layer was closed using 2-0 Vicryl, followed by a running 3-0 nylon suture.  The patient was then turned supine, again carefully controlling his neck at all times.  The Mayfield tongs were removed. The pin sites were inspected and found to be dry. We then placed an Aspen circle collar.  He was extubated without difficulty and able to move his upper and lower extremities.  Then he was transferred to the recovery room  in stable condition. Dictated by:   Patricia Nettle, M.D. Attending Physician:  Patricia Nettle. DD:  06/29/01 TD:  06/30/01 Job: [id number]

## 2010-08-08 NOTE — Procedures (Signed)
NAME:  [name], [name]               ACCOUNT NO.:  [id number]   MEDICAL RECORD NO.:  [id number]          PATIENT TYPE:  OUT   LOCATION:  SLEEP CENTER                 FACILITY:  [facility]   PHYSICIAN:  Clinton D. Maple Hudson, M.D. DATE OF BIRTH:  Dec 01, 1945   DATE OF STUDY:  04/03/2005                              NOCTURNAL POLYSOMNOGRAM   REFERRING PHYSICIAN:  Dr. Alwyn Pea.   DATE OF STUDY:  April 03, 2005.   INDICATION FOR STUDY:  Hypersomnia with sleep apnea.   EPWORTH SLEEPINESS SCORE:  19/24.   BMI:  48.9.   WEIGHT:  350 pounds.   HOME MEDICATIONS:  Benicar, Lipitor, furosemide, nabumetone, potassium,  Cymbalta, doxazosin, aspirin.   SLEEP ARCHITECTURE:  Total sleep time 339 minutes with sleep efficiency 78%.  Stage I was 8%, stage II 70%, stages III and IV 9%, REM 13% of total sleep  time. Sleep latency 25 minutes, REM latency 323 minutes, awake after sleep  onset 70 minutes, arousal index 30. No bedtime medication taken.   RESPIRATORY DATA:  NPSG protocol requested:  Apnea/hypopnea index (AHI, RDI)  50.3 obstructive events per hour indicating severe obstructive sleep  apnea/hypopnea syndrome. This included 142 obstructive apneas and 142  hypopneas. Events were more common while supine (AHI 95.7) but significant  also while sleeping on side. REM AHI 84.7.   OXYGEN DATA:  Loud constant snoring through the study with oxygen  desaturation to a nadir of 64%. Mean oxygen saturation on room air was 87%.   CARDIAC DATA:  Sinus rhythm with occasional PVC.   MOVEMENT/PARASOMNIA:  Leg jerks were noted with little effect on sleep.   IMPRESSION/RECOMMENDATIONS:  1.  Severe obstructive sleep apnea/hypopnea syndrome, AHI 50.3 per hour with      positional events being more common while supine. Loud snoring was      associated with oxygen desaturation to a nadir of 64%.  2.  Consider return for C-PAP titration or evaluate for alternative      therapies. Weight loss should be a  long-term goal.      Clinton D. Maple Hudson, M.D.  Diplomate, Biomedical engineer of Sleep Medicine  Electronically Signed     CDY/MEDQ  D:  04/12/2005 09:52:43  T:  04/13/2005 01:28:55  Job:  [id number]

## 2010-08-08 NOTE — Op Note (Signed)
[facility]  Patient:    [name], [name]                      MRN: [id number] Proc. Date: 05/12/00 Adm. Date:  [id number] Disc. Date: [id number] Attending:  Pierce Crane                           Operative Report  ADDENDUM:  SURGEON:  Dr. Javier Docker, M.D.  ASSISTANT:  Kerrin Champagne, M.D. DD:  07/16/00 TD:  07/17/00 Job: [id number]

## 2010-08-08 NOTE — Procedures (Signed)
NAME:  [name], [name]               ACCOUNT NO.:  [id number]   MEDICAL RECORD NO.:  [id number]          PATIENT TYPE:  OUT   LOCATION:  SLEEP CENTER                 FACILITY:  [facility]   PHYSICIAN:  Clinton D. Maple Hudson, M.D. DATE OF BIRTH:  [birth year]   DATE OF STUDY:                              NOCTURNAL POLYSOMNOGRAM   REFERRING PHYSICIAN:  Dr. Alwyn Pea.   DATE OF STUDY:  May 29, 2005.   INDICATION FOR STUDY:  Hypersomnia with sleep apnea. Epworth sleepiness  score 15/24, BMI 48.9, weight 350 pounds. A diagnostic NPSG on April 03, 2005 had recorded an AHI of 50.3 per hour. CPAP titration was requested.   SLEEP ARCHITECTURE:  Total sleep time 296 minutes with sleep efficiency 66%.  Stage I was 7%, stage II 81%, stages III and IV absent, REM 12% of total  sleep time. Sleep latency 35 minutes, REM latency 137 minutes, awake after  sleep onset 115 minutes, arousal index 16.8. No bedtime medication taken.   RESPIRATORY DATA:  CPAP titration protocol. CPAP was titrated to 22 CWP, AHI  6.7 per hour. Satisfactory control was obtained at lower pressures, around  14 CWP, (AHI 1.2 per hour). This pressure range is likely to be more  comfortable although it may allow a little more breakthrough snoring. A  medium ResMed Ultra Mirage nasal/oral mask was used with heated humidifier.   OXYGEN DATA:  Snoring elimination required CPAP to 21 CWP with residual  breakthrough obstructive events.   CARDIAC DATA:  Sinus bradycardia with frequent PVCs, occasional PACs,  trigeminy and multifocal PVCs.   MOVEMENT/PARASOMNIA:  A total of 167 limb jerks were recorded of which 25  were associated with arousal or awakening for periodic limb movement with  arousal index of 5.1 per hour which is increased.   IMPRESSION/RECOMMENDATION:  1.  Continuous positive airway pressure was titrated for snoring control to      22 CWP, apnea-hypopnea index 6.7 per hour. Adequate control with higher      probability  of sustainable continuous positive airway pressure comfort      was associated with a continuous positive airway pressure of 14 CWP,      apnea-hypopnea index 1.2 per hour. This would be the suggested initial      trial pressure. A medium ResMed Ultra Mirage nasal/oral mask was used      with heated humidifier.  2.  Periodic limb movement with arousal, 5.1 per hour.  3.  Frequent premature ventricular contractions, including trigeminy and      multifocal premature ventricular contractions, occasional premature      atrial contractions.      Clinton D. Maple Hudson, M.D.  Diplomate, Biomedical engineer of Sleep Medicine  Electronically Signed     CDY/MEDQ  D:  05/31/2005 17:20:28  T:  06/01/2005 21:37:59  Job:  [id number]

## 2010-08-08 NOTE — Op Note (Signed)
. [facility]  Patient:    [name], [name] Visit Number: [id number] MRN: [id number]          Service Type: DSU Location: [facility] Attending Physician:  [name] Dictated by:   [name], M.D. Proc. Date: 01/11/01                             Operative Report  PREOPERATIVE DIAGNOSIS:  Left carpal tunnel syndrome.  POSTOPERATIVE DIAGNOSIS:  Left carpal tunnel syndrome.  PROCEDURE:  Left carpal tunnel release.  ANESTHESIA:  Bier block.  SURGEON:  [name], M.D.  ASSISTANT:  [name], P.A.  INDICATION FOR PROCEDURE:  The patient is a 65 year old male with a long history of bilateral hand pain and numbness.  He has had some nerve studies, which show severe bilateral carpal tunnel syndrome.  He is offered operative release.  The procedure was discussed with the patient and informed operative consent was obtained after discussion of the possible complications of, reaction to anesthesia, infection, and neurovascular injury.  DESCRIPTION OF PROCEDURE:  The patient was taken to the operating suite, where Bier block anesthetic was applied to the level of the forearm without difficulty.  He was positioned supine and prepped and draped in normal sterile fashion.  After administration of preop IV antibiotics, a small palmar incision was made just ulnar to the thenar flexion crease.  This did not cross the wrist flexion crease.  Dissection was carried down through the palmar fascia to expose the transverse carpal ligament.  This was incised under direct visualization.  Dissection was taken distally through the distal fascia of the forearm and proximally to the transverse arch of vessels.  There was some significant thickening of the transverse carpal ligament and some moderate compression of the nerve observed.  The wound was irrigated, followed by closure of the skin with nylon.  The tourniquet was deflated, and the  hand became pink and warm immediately.  Some Marcaine was injected about the incision site.  Adaptic was applied to the wound, followed by dry gauze, and a volar splint of plaster with the wrist in extension.  Estimated blood loss and intraoperative fluids as well as accurate tourniquet time can be obtained from anesthesia records.  DISPOSITION:  The patient was taken to the recovery room in stable condition. Plans were for him to go home the same day and follow up in the office in less than a week.  I will contact him by phone tonight. Dictated by:   Stephen Basque Jerl [name], M.D. Attending Physician:  [name] DD:  01/11/01 TD:  01/12/01 Job: [id number]

## 2010-08-08 NOTE — Op Note (Signed)
[facility]  Patient:    [name], [name]                      MRN: [id number] Proc. Date: 05/12/00 Adm. Date:  [id number] Attending:  Pierce Crane                           Operative Report  PREOPERATIVE DIAGNOSES: 1. 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 2. Herniated nucleus pulposus at L3-4 and L4-5l.  POSTOPERATIVE DIAGNOSES: 1. 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 2. Herniated nucleus pulposus at L3-4 and L5-S1.  OPERATION: 1. Lumbar decompression central of L2-3, L3-4, and L4-5. 2. Complete laminectomies of L3 and L4, partial laminectomy of L2 and L5. 3. Microdiskectomy of L4-5. 4. Foraminotomies of L3, L4, and L5.  SURGEON:  Javier Docker, M.D.  ANESTHESIA:  General.  BRIEF HISTORY AND INDICATIONS:  A 65 year old with congenital stenosis, bilateral lower extremity neurogenic claudication.  A work-related injury produced an HNP at 3-4 and 4-5 placing the patient at neural compression.  The patient failed conservation.  Operative intervention is indicated for diskectomy with decompression due to underlying congenital stenosis at 2-3 to 4-5 and possibly at 5-1.  Due to his congenital stenosis, it is felt central decompression would be optimal for decompressive exposure and safety.  Risks and benefits of the procedure were discussed including bleeding, infection, damage to neurovascular structures, CSF leakage, epidural fibrosis, cauda equina, etc.  TECHNIQUE:  The patient was placed in the supine position after induction of adequate general anesthesia with 1 g Kefzol for IV prophylaxis.  The patient was placed prone on the [place] frame.  All bony prominences were well padded. The lumbar region was prepped and draped in the usual sterile fashion.  Two 18-gauge spinal needles were utilized to localize from L2 to L4.  Marcaine 0.25% with epinephrine was infiltrated in subcutaneous tissue from L2 to  L4. Incision was made from the spinous process at L2 to S1.  Subcutaneous tissue was dissected.  Electrocautery was utilized to achieve hemostasis.  Dorsal lumbar fascia was identified and divided in line with the skin incision. Paraspinous muscles were elevated from the lamina of L2, 3, 4, and 5 bilaterally.  McCullough retractor was placed.  Kocher was placed, and spinous process at 2 and 3 confirmed with x-ray.  Next, a Leksell rongeur was utilized to remove the spinous processes of 2, 3, 4, and partially of 5.  Following this a 2 followed by a 3 mm Kerrison was utilized to then perform laminectomies of partial L2, L3, L4, and partial of L5 centrally.  Severe stenosis was noted at 2-3 and at 3-4.  Significant hypertrophic ligamentum flavum was noted at these levels as well, less so at 4-5.  Following the central decompression with the neural elements well protected, high-speed bur was utilized to perform partial medial hemifacetectomies.  With the neural elements well protected, lateral recesses were decompressed bilaterally utilizing the 2 mm Kerrison.  Severe lateral recess stenosis was noted at 2-3, 3-4, and at 4-5.  Next, the foramina were noted to be stenotic, predominantly at L3 and L4, and foraminotomy was performed with 2 mm Kerrison, protecting neural elements at all times.  Thecal sac was noted to expand to its normal dimension following this decompression.  HNP was noted at 4-5 on the left, and annulotomy was performed with the neural elements well protected.  This was  compressing the 4 and 5 roots, and diskectomy was performed.  Following this, there was no residual pressure on the 4 and 5 roots.  Examination of 3-4 and 2-3 disks revealed it protruding centrally, but it was a hard calcified disk, not amenable to diskectomy.  With the central decompression and the foraminotomies, there was no residual pressure on the thecal sac or the nerve roots of 2, 3, 4, or 5  bilaterally. Hockey stick probe placed in these foramen bilaterally revealed that they were widely patent following the decompression.  The operating microscope had been draped and brought into the surgical field and utilized to perform the microdiskectomy as well as utilization of cautery for electrocautery.  Next, the wound was copiously irrigated throughout.   Inspection revealed no CSF leakage or active bleeding.  Confirmatory radiograph obtained confirmed the decompression from the L2-3 space to the foramen of 5 bilaterally at the 4-5 space.  It was felt that decompression distally was not warranted due to the patency of the canal following the decompression.  Again, wound copiously irrigated.  Inspections bilaterally revealed no CSF leakage or active bleeding.  Thrombin-soaked Gelfoam was placed in the laminotomy defect.  A J-P drain was placed and brought out through a stab wound in the skin. Paraspinous musculature was reapproximated with 2-0 Vicryl simple sutures, closing the defect.  There was no evidence of active bleeding.  Gross lumbar fascia was reapproximated with watertight closure with #1 Vicryl interrupted figure-of-eight sutures.  Subcutaneous tissue was reapproximated with 2-0 Vicryl simple sutures.  Skin was reapproximated staples.  The wound was dressed sterilely.  The patient was placed supine on the hospital bed.  J-P was charged, extubated without difficulty, and transported to the recovery room in satisfactory condition.  The patient tolerated the procedure well without complication. DD:  05/12/00 TD:  05/13/00 Job: [id number]

## 2010-12-29 LAB — COMPREHENSIVE METABOLIC PANEL
ALT: 22
Alkaline Phosphatase: 62
BUN: 22
CO2: 27
Calcium: 8.7
GFR calc non Af Amer: 57 — ABNORMAL LOW
Glucose, Bld: 114 — ABNORMAL HIGH
Sodium: 139
Total Protein: 6.8

## 2010-12-29 LAB — CULTURE, ROUTINE-ABSCESS

## 2010-12-29 LAB — DIFFERENTIAL
Basophils Absolute: 0
Eosinophils Relative: 1
Lymphs Abs: 1.2
Monocytes Absolute: 1.1 — ABNORMAL HIGH
Monocytes Relative: 9
Neutro Abs: 10.2 — ABNORMAL HIGH
Neutrophils Relative %: 80 — ABNORMAL HIGH

## 2010-12-29 LAB — CBC
HCT: 35.7 — ABNORMAL LOW
Hemoglobin: 11.8 — ABNORMAL LOW
MCHC: 33.5
MCV: 90
MCV: 90.5
Platelets: 171
RBC: 3.94 — ABNORMAL LOW
RDW: 14.9
WBC: 6.6

## 2010-12-29 LAB — URINALYSIS, ROUTINE W REFLEX MICROSCOPIC
Glucose, UA: NEGATIVE
Hgb urine dipstick: NEGATIVE
Protein, ur: NEGATIVE
Specific Gravity, Urine: 1.016
pH: 5.5

## 2010-12-29 LAB — PROTIME-INR
INR: 1.1
Prothrombin Time: 14.5

## 2011-07-14 ENCOUNTER — Ambulatory Visit (INDEPENDENT_AMBULATORY_CARE_PROVIDER_SITE_OTHER): Payer: Medicare Other | Admitting: Family Medicine

## 2011-07-14 VITALS — BP 114/73 | HR 85 | Temp 98.7°F | Resp 24 | Ht 70.0 in | Wt 340.8 lb

## 2011-07-14 DIAGNOSIS — G4733 Obstructive sleep apnea (adult) (pediatric): Secondary | ICD-10-CM

## 2011-07-14 NOTE — Progress Notes (Signed)
  Patient Name: [name] Date of Birth: April 20, 1945 Medical Record Number: [id number] Gender: male Date of Encounter: 07/14/2011  History of Present Illness:  [name] is a 66 y.o. very pleasant male patient who presents with the following:  Here to get an order to have [employer] read his CPAP results so we can do his DOT.  His DOT exam is due next month. Otherwise he is doing well and has no other concerns  There is no problem list on file for this patient.  No past medical history on file. No past surgical history on file. History  Substance Use Topics  . Smoking status: Never Smoker   . Smokeless tobacco: Former Neurosurgeon    Types: Chew  . Alcohol Use: Not on file   No family history on file. Not on File  Medication list has been reviewed and updated.  Review of Systems: As per HPI- otherwise negative.  Physical Examination: Filed Vitals:   07/14/11 1156  BP: 114/73  Pulse: 85  Temp: 98.7 F (37.1 C)  TempSrc: Oral  Resp: 24  Height: 5\' 10"  (1.778 m)  Weight: 340 lb 12.8 oz (154.586 kg)    Body mass index is 48.90 kg/(m^2).  GEN: WDWN, NAD, Non-toxic, A & O x 3, morbid obesity HEENT: Atraumatic, Normocephalic. Neck supple. No masses, No LAD. Ears and Nose: No external deformity. CV: RRR, No M/G/R. No JVD. No thrill. No extra heart sounds. PULM: CTA B, no wheezes, crackles, rhonchi. No retractions. No resp. distress. No accessory muscle use. EXTR: No c/c/e NEURO Normal gait.  PSYCH: Normally interactive. Conversant. Not depressed or anxious appearing.  Calm demeanor.    Assessment and Plan: 1. OSA (obstructive sleep apnea)    Did letter requesting that [employer] read his CPAP.  Advised him that next year he could probably just call and ask [place] to send this letter- he does not need to be seen to have this done.

## 2011-07-22 ENCOUNTER — Ambulatory Visit: Payer: Medicare Other | Admitting: Family Medicine

## 2011-07-22 VITALS — BP 118/74 | HR 96 | Temp 98.4°F | Resp 24 | Ht 70.0 in | Wt 338.2 lb

## 2011-07-22 DIAGNOSIS — Z0289 Encounter for other administrative examinations: Secondary | ICD-10-CM

## 2011-07-22 NOTE — Progress Notes (Signed)
[facility]:  Office Visit  Chief Complaint:  Chief Complaint  Patient presents with  . Annual Exam    dot pe-personal    HPI: [name] is a 66 y.o. male who complains of : self pay DOT. He has no complaints.    Past Medical History  Diagnosis Date  . Hyperlipidemia   . Hypertension   . Sleep apnea, obstructive     on CPAP  . BPH (benign prostatic hyperplasia)   . Glaucoma   . Cataract    Past Surgical History  Procedure Date  . Right heel      (congenital birth defect)  . Spine surgery    History   Social History  . Marital Status: Married    Spouse Name: N/A    Number of Children: N/A  . Years of Education: N/A   Social History Main Topics  . Smoking status: Never Smoker   . Smokeless tobacco: Former Neurosurgeon    Types: Chew  . Alcohol Use: No  . Drug Use: No  . Sexually Active: None   Other Topics Concern  . None   Social History Narrative  . None   Family History  Problem Relation Age of Onset  . Adopted: Yes   Not on File Prior to Admission medications   Medication Sig Start Date End Date Taking? Authorizing Provider  aspirin 81 MG tablet Take 81 mg by mouth daily.   Yes Historical Provider, MD  atorvastatin (LIPITOR) 20 MG tablet Take 20 mg by mouth daily.   Yes Historical Provider, MD  doxazosin (CARDURA) 8 MG tablet Take 8 mg by mouth 2 (two) times daily.   Yes Historical Provider, MD  furosemide (LASIX) 40 MG tablet Take 40 mg by mouth 2 (two) times daily.   Yes Historical Provider, MD  latanoprost (XALATAN) 0.005 % ophthalmic solution 1 drop at bedtime.   Yes Historical Provider, MD  nabumetone (RELAFEN) 750 MG tablet Take 750 mg by mouth 2 (two) times daily.   Yes Historical Provider, MD  olanzapine-FLUoxetine (SYMBYAX) 6-50 MG per capsule Take 1 capsule by mouth every morning.   Yes Historical Provider, MD  spironolactone (ALDACTONE) 50 MG tablet Take 50 mg by mouth 3 (three) times a week.   Yes Historical Provider,  MD     ROS: The patient denies fevers, chills, night sweats, unintentional weight loss, chest pain, palpitations, wheezing, dyspnea on exertion, nausea, vomiting, abdominal pain, dysuria, hematuria, melena, numbness, weakness, or tingling.   All other systems have been reviewed and were otherwise negative with the exception of those mentioned in the HPI and as above.    PHYSICAL EXAM: Filed Vitals:   07/22/11 1116  BP: 118/74  Pulse:   Temp:   Resp:    Filed Vitals:   07/22/11 1111  Height: 5\' 10"  (1.778 m)  Weight: 338 lb 3.2 oz (153.407 kg)   Body mass index is 48.53 kg/(m^2).  General: Alert, no acute distress HEENT:  Normocephalic, atraumatic, oropharynx patent.  Cardiovascular:  Regular rate and rhythm, no rubs murmurs or gallops.  No Carotid bruits, radial pulse intact. No pedal edema.  Respiratory: Clear to auscultation bilaterally.  No wheezes, rales, or rhonchi.  No cyanosis, no use of accessory musculature GI: No organomegaly, abdomen is soft and non-tender, positive bowel sounds.  No masses. Skin: No rashes. Neurologic: Facial musculature symmetric. Psychiatric: Patient is appropriate throughout our interaction. Lymphatic: No cervical lymphadenopathy Musculoskeletal: Gait intact.  A/P: Patient evaluated and  cleared for 1 year for DOT PE due to HTN and OSA

## 2012-05-31 ENCOUNTER — Ambulatory Visit (INDEPENDENT_AMBULATORY_CARE_PROVIDER_SITE_OTHER): Payer: Medicare Other | Admitting: Emergency Medicine

## 2012-05-31 VITALS — BP 112/68 | HR 80 | Temp 97.5°F | Resp 18 | Wt 343.0 lb

## 2012-05-31 DIAGNOSIS — I872 Venous insufficiency (chronic) (peripheral): Secondary | ICD-10-CM

## 2012-05-31 DIAGNOSIS — I10 Essential (primary) hypertension: Secondary | ICD-10-CM

## 2012-05-31 DIAGNOSIS — E782 Mixed hyperlipidemia: Secondary | ICD-10-CM

## 2012-05-31 NOTE — Progress Notes (Signed)
[facility] [address] [place] [address] [id number]  Date:  05/31/2012   Name:  [name]   DOB:  09-14-1945   MRN:  [id number]  PCP:  No primary provider on file.    Chief Complaint: fluid build up on legs   History of Present Illness:  TREYTEN MONESTIME is a 67 y.o. very pleasant male patient who presents with the following:  Has chronic venous insufficiency with hyperpigmentation ankles and distal lower legs.  Moderate swelling.  No pain.  Wants to establish care here in 104.  There is no problem list on file for this patient.   Past Medical History  Diagnosis Date  . Hyperlipidemia   . Hypertension   . Sleep apnea, obstructive     on CPAP  . BPH (benign prostatic hyperplasia)   . Glaucoma(365)   . Cataract   . Arthritis   . CHF (congestive heart failure)   . Depression   . Anxiety     Past Surgical History  Procedure Laterality Date  . Right heel       (congenital birth defect)  . Spine surgery      History  Substance Use Topics  . Smoking status: Never Smoker   . Smokeless tobacco: Former Neurosurgeon    Types: Chew  . Alcohol Use: No    Family History  Problem Relation Age of Onset  . Adopted: Yes    No Known Allergies  Medication list has been reviewed and updated.  Current Outpatient Prescriptions on File Prior to Visit  Medication Sig Dispense Refill  . aspirin 81 MG tablet Take 81 mg by mouth daily.      Marland Kitchen atorvastatin (LIPITOR) 20 MG tablet Take 20 mg by mouth daily.      Marland Kitchen doxazosin (CARDURA) 8 MG tablet Take 8 mg by mouth 2 (two) times daily.      . furosemide (LASIX) 40 MG tablet Take 40 mg by mouth 2 (two) times daily.      Marland Kitchen latanoprost (XALATAN) 0.005 % ophthalmic solution 1 drop at bedtime.      . nabumetone (RELAFEN) 750 MG tablet Take 750 mg by mouth 2 (two) times daily.      Marland Kitchen spironolactone (ALDACTONE) 50 MG tablet Take 50 mg by mouth 3 (three) times a week.       No current facility-administered  medications on file prior to visit.    Review of Systems:  As per HPI, otherwise negative.    Physical Examination: Filed Vitals:   05/31/12 0941  BP: 112/68  Pulse: 80  Temp: 97.5 F (36.4 C)  Resp: 18   Filed Vitals:   05/31/12 0941  Weight: 343 lb (155.584 kg)   Body mass index is 49.22 kg/(m^2). Ideal Body Weight:    GEN: WDWN, NAD, Non-toxic, A & O x 3 HEENT: Atraumatic, Normocephalic. Neck supple. No masses, No LAD. Ears and Nose: No external deformity. CV: RRR, No M/G/R. No JVD. No thrill. No extra heart sounds. PULM: CTA B, no wheezes, crackles, rhonchi. No retractions. No resp. distress. No accessory muscle use. ABD: S, NT, ND, +BS. No rebound. No HSM. EXTR: No c/c. Has 3+ pitting lowers to mid calf.  Hyperpigmented.  Onychomycosis.  Pulses absent NEURO Normal gait.  PSYCH: Normally interactive. Conversant. Not depressed or anxious appearing.  Calm demeanor.    Assessment and Plan: Venous insufficiency Vein consult.  Carmelina Dane, MD

## 2012-05-31 NOTE — Patient Instructions (Addendum)
Venous Stasis and Chronic Venous Insufficiency As people age, the veins located in their legs may weaken and stretch. When veins weaken and lose the ability to pump blood effectively, the condition is called chronic venous insufficiency (CVI) or venous stasis. Almost all veins return blood back to the heart. This happens by:  The force of the heart pumping fresh blood pushes blood back to the heart.  Blood flowing to the heart from the force of gravity. In the deep veins of the legs, blood has to fight gravity and flow upstream back to the heart. Here, the leg muscles contract to pump blood back toward the heart. Vein walls are elastic, and many veins have small valves that only allow blood to flow in one direction. When leg muscles contract, they push inward against the elastic vein walls. This squeezes blood upward, opens the valves, and moves blood toward the heart. When leg muscles relax, the vein wall also relaxes and the valves inside the vein close to prevent blood from flowing backward. This method of pumping blood out of the legs is called the venous pump. CAUSES  The venous pump works best while walking and leg muscles are contracting. But when a person sits or stands, blood pressure in leg veins can build. Deep veins are usually able to withstand short periods of inactivity, but long periods of inactivity (and increased pressure) can stretch, weaken, and damage vein walls. High blood pressure can also stretch and damage vein walls. The veins may no longer be able to pump blood back to the heart. Venous hypertension (high blood pressure inside veins) that lasts over time is a primary cause of CVI. CVI can also be caused by:   Deep vein thrombosis, a condition where a thrombus (blood clot) blocks blood flow in a vein.  Phlebitis, an inflammation of a superficial vein that causes a blood clot to form. Other risk factors for CVI may include:   Heredity.  Obesity.  Pregnancy.  Sedentary  lifestyle.  Smoking.  Jobs requiring long periods of standing or sitting in one place.  Age and gender:  Women in their 40's and 50's and men in their 70's are more prone to developing CVI. SYMPTOMS  Symptoms of CVI may include:   Varicose veins.  Ulceration or skin breakdown.  Lipodermatosclerosis, a condition that affects the skin just above the ankle, usually on the inside surface. Over time the skin becomes brown, smooth, tight and often painful. Those with this condition have a high risk of developing skin ulcers.  Reddened or discolored skin on the leg.  Swelling. DIAGNOSIS  Your caregiver can diagnose CVI after performing a careful medical history and physical examination. To confirm the diagnosis, the following tests may also be ordered:   Duplex ultrasound.  Plethysmography (tests blood flow).  Venograms (x-ray using a special dye). TREATMENT The goals of treatment for CVI are to restore a person to an active life and to minimize pain or disability. Typically, CVI does not pose a serious threat to life or limb, and with proper treatment most people with this condition can continue to lead active lives. In most cases, mild CVI can be treated on an outpatient basis with simple procedures. Treatment methods include:   Elastic compression socks.  Sclerotherapy, a procedure involving an injection of a material that "dissolves" the damaged veins. Other veins in the network of blood vessels take over the function of the damaged veins.  Vein stripping (an older procedure less commonly used).    Laser Ablation surgery.  Valve repair. HOME CARE INSTRUCTIONS   Elastic compression socks must be worn every day. They can help with symptoms and lower the chances of the problem getting worse, but they do not cure the problem.  Only take over-the-counter or prescription medicines for pain, discomfort, or fever as directed by your caregiver.  Your caregiver will review your other  medications with you. SEEK MEDICAL CARE IF:   You are confused about how to take your medications.  There is redness, swelling, or increasing pain in the affected area.  There is a red streak or line that extends up or down from the affected area.  There is a breakdown or loss of skin in the affected area, even if the breakdown is small.  You develop an unexplained oral temperature above 102 F (38.9 C).  There is an injury to the affected area. SEEK IMMEDIATE MEDICAL CARE IF:   There is an injury and open wound to the affected area.  Pain is not adequately relieved with pain medication prescribed or becomes severe.  An oral temperature above 102 F (38.9 C) develops.  The foot/ankle below the affected area becomes suddenly numb or the area feels weak and hard to move. MAKE SURE YOU:   Understand these instructions.  Will watch your condition.  Will get help right away if you are not doing well or get worse. Document Released: 07/13/2006 Document Revised: 06/01/2011 Document Reviewed: 09/20/2006 [employer] Patient Information 2013 [employer].  

## 2012-06-06 ENCOUNTER — Other Ambulatory Visit: Payer: Self-pay

## 2012-06-06 ENCOUNTER — Encounter: Payer: Self-pay | Admitting: Vascular Surgery

## 2012-06-06 DIAGNOSIS — R0989 Other specified symptoms and signs involving the circulatory and respiratory systems: Secondary | ICD-10-CM

## 2012-06-06 DIAGNOSIS — I872 Venous insufficiency (chronic) (peripheral): Secondary | ICD-10-CM

## 2012-06-06 DIAGNOSIS — M7989 Other specified soft tissue disorders: Secondary | ICD-10-CM

## 2012-06-17 ENCOUNTER — Ambulatory Visit: Payer: Self-pay | Admitting: Family Medicine

## 2012-06-17 VITALS — BP 130/82 | HR 89 | Temp 98.0°F | Resp 17 | Ht 71.5 in | Wt 342.0 lb

## 2012-06-17 DIAGNOSIS — I10 Essential (primary) hypertension: Secondary | ICD-10-CM | POA: Insufficient documentation

## 2012-06-17 DIAGNOSIS — Z0000 Encounter for general adult medical examination without abnormal findings: Secondary | ICD-10-CM

## 2012-06-17 DIAGNOSIS — F32A Depression, unspecified: Secondary | ICD-10-CM | POA: Insufficient documentation

## 2012-06-17 DIAGNOSIS — Z0289 Encounter for other administrative examinations: Secondary | ICD-10-CM

## 2012-06-17 DIAGNOSIS — F329 Major depressive disorder, single episode, unspecified: Secondary | ICD-10-CM

## 2012-06-17 NOTE — Progress Notes (Signed)
[facility] [address] [place] [address] [phone]  Date:  06/17/2012   Name:  [name]   DOB:  1945-05-04   MRN:  [id number]  PCP:  No primary provider on file.    Chief Complaint: Employment Physical   History of Present Illness:  [name] is a 67 y.o. very pleasant male patient who presents with the following:  Here today for a DOT exam.  History of HTN, obesity, OSA on CPAP, gluacoma, cataracts, CHF and depression.  His eye doctor is Dr. Sallyanne Kuster- he has glaucoma and possibly early cataracts.  Last saw his eye doctor last month.  He is treated by Dr. Sallyanne Kuster with [facility].    He did have an episode of CHF "a long time ago," about 10 years ago.  This has not recurred. He is now on diuretics and doing well.    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 He hopes to retire in the next few years.    He brings with him records from his CPAP reading, which shows good compliance with his CPAP treatment.   He reports that his depression is under control  There is no problem list on file for this patient.   Past Medical History  Diagnosis Date  . Hyperlipidemia   . Hypertension   . Sleep apnea, obstructive     on CPAP  . BPH (benign prostatic hyperplasia)   . Glaucoma(365)   . Cataract   . Arthritis   . CHF (congestive heart failure)   . Depression   . Anxiety     Past Surgical History  Procedure Laterality Date  . Right heel       (congenital birth defect)  . Spine surgery      History  Substance Use Topics  . Smoking status: Never Smoker   . Smokeless tobacco: Former Neurosurgeon    Types: Chew  . Alcohol Use: No    Family History  Problem Relation Age of Onset  . Adopted: Yes    No Known Allergies  Medication list has been reviewed and updated.  Current Outpatient Prescriptions on File Prior to Visit  Medication Sig Dispense Refill  . aspirin 81 MG tablet Take 81 mg by mouth daily.       Marland Kitchen atorvastatin (LIPITOR) 20 MG tablet Take 20 mg by mouth daily.      Marland Kitchen doxazosin (CARDURA) 8 MG tablet Take 8 mg by mouth 2 (two) times daily.      . furosemide (LASIX) 40 MG tablet Take 40 mg by mouth 2 (two) times daily.      Marland Kitchen latanoprost (XALATAN) 0.005 % ophthalmic solution 1 drop at bedtime.      . nabumetone (RELAFEN) 750 MG tablet Take 750 mg by mouth 2 (two) times daily.      Marland Kitchen olanzapine-FLUoxetine (SYMBYAX) 6-50 MG per capsule Take 1 capsule by mouth every evening.      Marland Kitchen spironolactone (ALDACTONE) 50 MG tablet Take 50 mg by mouth 3 (three) times a week.       No current facility-administered medications on file prior to visit.    Review of Systems:  As per HPI- otherwise negative.   Physical Examination: Filed Vitals:   06/17/12 1236  BP: 130/82  Pulse: 89  Temp: 98 F (36.7 C)  Resp: 17   Filed Vitals:   06/17/12 1236  Height: 5' 11.5" (1.816 m)  Weight: 342  lb (155.13 kg)   Body mass index is 47.04 kg/(m^2). Ideal Body Weight: Weight in (lb) to have BMI = 25: 181.4  GEN: WDWN, NAD, Non-toxic, A & O x 3, morbid obesity HEENT: Atraumatic, Normocephalic. Neck supple. No masses, No LAD.  Bilateral TM wnl, oropharynx normal.  PEERL,EOMI.   Scar on back of neck from spine surgery, but adequate neck ROM Ears and Nose: No external deformity. CV: RRR, No M/G/R. No JVD. No thrill. No extra heart sounds. PULM: CTA B, no wheezes, crackles, rhonchi. No retractions. No resp. distress. No accessory muscle use. ABD: S, NT, ND, +BS. No rebound. No HSM. EXTR: No c/c/e. Normal strength all extremities NEURO Normal gait.  PSYCH: Normally interactive. Conversant. Not depressed or anxious appearing.  Calm demeanor.  No inguinal hernia See long form scanned into chart  Assessment and Plan: Physical exam, annual  Given one year card today, due to history of HTN.  Asked him to bring in records from his eye doctor in the future.  Called and spoke with Dr. Sallyanne Kuster today, who  felt his vision is ok to allow him to keep driving.  Requested records from her office.  Continue to treat HTN as he is doing.    Signed Abbe Amsterdam, MD

## 2012-07-01 ENCOUNTER — Encounter: Payer: Self-pay | Admitting: Vascular Surgery

## 2012-07-04 ENCOUNTER — Encounter (INDEPENDENT_AMBULATORY_CARE_PROVIDER_SITE_OTHER): Payer: Medicare Other | Admitting: *Deleted

## 2012-07-04 ENCOUNTER — Encounter: Payer: Self-pay | Admitting: Vascular Surgery

## 2012-07-04 ENCOUNTER — Ambulatory Visit (INDEPENDENT_AMBULATORY_CARE_PROVIDER_SITE_OTHER): Payer: Medicare Other | Admitting: Vascular Surgery

## 2012-07-04 VITALS — BP 110/70 | HR 70 | Resp 18 | Ht 71.0 in | Wt 342.0 lb

## 2012-07-04 DIAGNOSIS — M7989 Other specified soft tissue disorders: Secondary | ICD-10-CM

## 2012-07-04 DIAGNOSIS — R0989 Other specified symptoms and signs involving the circulatory and respiratory systems: Secondary | ICD-10-CM

## 2012-07-04 DIAGNOSIS — I872 Venous insufficiency (chronic) (peripheral): Secondary | ICD-10-CM

## 2012-07-04 NOTE — Progress Notes (Signed)
Subjective:     Patient ID: [name], male   DOB: 1945-04-28, 67 y.o.   MRN: [id number]  HPI this 67 year old male was referred by Dr. Dareen Piano for evaluation of severe bilateral venous insufficiency. Patient has had chronic edema in both legs for the last several years becoming worse each year. He has no history of stasis ulcers. It is not wear elastic compression stockings nor elevate his legs on a regular basis. He developed aching throbbing and burning discomfort in both legs as the day progresses. He has no history of DVT, thrombophlebitis, or bleeding.  Past Medical History  Diagnosis Date  . Hyperlipidemia   . Hypertension   . Sleep apnea, obstructive     on CPAP  . BPH (benign prostatic hyperplasia)   . Glaucoma(365)   . Cataract   . Arthritis   . CHF (congestive heart failure)   . Depression   . Anxiety     History  Substance Use Topics  . Smoking status: Never Smoker   . Smokeless tobacco: Former Neurosurgeon    Types: Chew  . Alcohol Use: No    Family History  Problem Relation Age of Onset  . Adopted: Yes    No Known Allergies  Current outpatient prescriptions:aspirin 81 MG tablet, Take 81 mg by mouth daily., Disp: , Rfl: ;  atorvastatin (LIPITOR) 20 MG tablet, Take 20 mg by mouth daily., Disp: , Rfl: ;  doxazosin (CARDURA) 8 MG tablet, Take 8 mg by mouth 2 (two) times daily., Disp: , Rfl: ;  furosemide (LASIX) 40 MG tablet, Take 40 mg by mouth 2 (two) times daily., Disp: , Rfl: ;  latanoprost (XALATAN) 0.005 % ophthalmic solution, 1 drop at bedtime., Disp: , Rfl:  nabumetone (RELAFEN) 750 MG tablet, Take 750 mg by mouth 2 (two) times daily., Disp: , Rfl: ;  olanzapine-FLUoxetine (SYMBYAX) 6-50 MG per capsule, Take 1 capsule by mouth every evening., Disp: , Rfl: ;  spironolactone (ALDACTONE) 50 MG tablet, Take 50 mg by mouth 3 (three) times a week., Disp: , Rfl:   BP 110/70  Pulse 70  Resp 18  Ht 5\' 11"  (1.803 m)  Wt 342 lb (155.13 kg)  BMI 47.72 kg/m2  Body mass  index is 47.72 kg/(m^2).           Review of Systems denies chest pain but does have dyspnea on exertion, chronic morbid obesity, COPD requiring CPAP at night, history of infection of left foot-MRSA resolved 2 years ago    Objective:   Physical Exam blood pressure 110/70 heart rate 70 respirations 18 Gen.-alert and oriented x3 in no apparent distress-morbidly obese  HEENT normal for age Lungs no rhonchi or wheezing Cardiovascular regular rhythm no murmurs carotid pulses 3+ palpable no bruits audible Abdomen soft nontender no palpable masses Musculoskeletal free of  major deformities Skin -hyperpigmentation beginning in mid calf both lower extremities down surrounding ankles laterally and medially with no active ulceration  Neurologic normal Lower extremities 3+ femoral and dorsalis pedis pulses palpable bilaterally with chronic 1-2+ edema. No active ulcers. Hyperpigmentation as noted above. No bulging varicosities noted.  Today I ordered bilateral lower extremity venous duplex exam which I reviewed and interpreted. The right great saphenous system has gross reflux as does the right deep system. There is no DVT in the right leg. Left leg has deep venous reflux also but no superficial reflux.      Assessment:     Severe bilateral venous insufficiency with severe skin changes but  no active ulceration and chronic edema with gross reflux right great saphenous system and bilateral deep venous systems-becoming more severe    Plan:     #1 long-leg elastic compression stockings 20-30 mm gradient #2 elevate foot of bed and intermittently elevate legs during the day #3 ibuprofen on a regular basis for discomfort #4 return in 3 months. If no significant improvement in skin characteristics or symptoms patient needs a laser ablation right great saphenous vein to improve situation on that leg. Following this patient will need chronic medical management with elevation of legs on an ongoing  basis and bilateral compression stockings

## 2012-07-09 ENCOUNTER — Encounter: Payer: Self-pay | Admitting: Family Medicine

## 2012-09-30 ENCOUNTER — Encounter: Payer: Self-pay | Admitting: Vascular Surgery

## 2012-10-03 ENCOUNTER — Encounter: Payer: Self-pay | Admitting: Vascular Surgery

## 2012-10-03 ENCOUNTER — Ambulatory Visit (INDEPENDENT_AMBULATORY_CARE_PROVIDER_SITE_OTHER): Payer: Medicare Other | Admitting: Vascular Surgery

## 2012-10-03 VITALS — BP 128/78 | HR 70 | Resp 18 | Ht 71.0 in | Wt 340.0 lb

## 2012-10-03 DIAGNOSIS — I872 Venous insufficiency (chronic) (peripheral): Secondary | ICD-10-CM

## 2012-10-03 NOTE — Progress Notes (Signed)
Subjective:     Patient ID: [name], male   DOB: Aug 22, 1945, 67 y.o.   MRN: [id number]  HPI this 67 year old male returns for followup regarding his severe venous insufficiency of the right leg. He continues to have edema which has been slightly improved but long light elastic compression stockings. He has severe skin changes and aching throbbing and burning discomfort despite trying long-leg elastic compression stockings 20-30 mm gradient and elevation and ibuprofen. 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 This is affecting his ability to work.  Past Medical History  Diagnosis Date  . Hyperlipidemia   . Hypertension   . Sleep apnea, obstructive     on CPAP  . BPH (benign prostatic hyperplasia)   . Glaucoma   . Cataract   . Arthritis   . CHF (congestive heart failure)   . Depression   . Anxiety     History  Substance Use Topics  . Smoking status: Never Smoker   . Smokeless tobacco: Former Neurosurgeon    Types: Chew    Quit date: 10/03/1972  . Alcohol Use: No    Family History  Problem Relation Age of Onset  . Adopted: Yes    No Known Allergies  Current outpatient prescriptions:aspirin 81 MG tablet, Take 81 mg by mouth daily., Disp: , Rfl: ;  atorvastatin (LIPITOR) 20 MG tablet, Take 20 mg by mouth daily., Disp: , Rfl: ;  doxazosin (CARDURA) 8 MG tablet, Take 8 mg by mouth 2 (two) times daily., Disp: , Rfl: ;  furosemide (LASIX) 40 MG tablet, Take 40 mg by mouth 2 (two) times daily., Disp: , Rfl: ;  latanoprost (XALATAN) 0.005 % ophthalmic solution, 1 drop at bedtime., Disp: , Rfl:  nabumetone (RELAFEN) 750 MG tablet, Take 750 mg by mouth 2 (two) times daily., Disp: , Rfl: ;  olanzapine-FLUoxetine (SYMBYAX) 6-50 MG per capsule, Take 1 capsule by mouth every evening., Disp: , Rfl: ;  spironolactone (ALDACTONE) 50 MG tablet, Take 50 mg by mouth 3 (three) times a week., Disp: , Rfl:   BP 128/78  Pulse 70  Resp 18  Ht 5'  11" (1.803 m)  Wt 340 lb (154.223 kg)  BMI 47.44 kg/m2  Body mass index is 47.44 kg/(m^2).        Review of Systems denies chest pain, dyspnea on exertion, PND, orthopnea     Objective:   Physical Exam blood pressure 120/78 heart rate 70 respirations 18 Gen. obese well-developed well-nourished male in no apparent distress alert and oriented x3 Lungs no rhonchi or wheezing Right lower extremity with 3+ femoral dorsalis pedis pulse palpable. Severe hyperpigmentation lower third right leg no active ulcer and chronic 1+ edema-no bulging varicosities    Assessment:     Severe venous insufficiency right leg with documented gross reflux right great saphenous system with chronic edema and severe skin changes. Affecting the patient's ability to work    Plan:     Patient needs a laser ablation right great saphenous vein and then chronic short leg elastic compression stockings We'll proceed with precertification to perform this in the near future

## 2012-10-21 ENCOUNTER — Other Ambulatory Visit: Payer: Self-pay | Admitting: *Deleted

## 2012-10-21 DIAGNOSIS — I83893 Varicose veins of bilateral lower extremities with other complications: Secondary | ICD-10-CM

## 2012-11-14 ENCOUNTER — Other Ambulatory Visit: Payer: Medicare Other | Admitting: Vascular Surgery

## 2012-11-22 ENCOUNTER — Ambulatory Visit: Payer: Medicare Other | Admitting: Vascular Surgery

## 2013-04-22 ENCOUNTER — Ambulatory Visit: Payer: Medicare Other | Admitting: Internal Medicine

## 2013-04-22 VITALS — BP 110/66 | HR 76 | Temp 97.5°F | Resp 16 | Ht 70.0 in | Wt 337.0 lb

## 2013-04-22 NOTE — Progress Notes (Signed)
   Subjective:    Patient ID: [name], male    DOB: 07/25/1945, 68 y.o.   MRN: [id number]  HPI    Review of Systems     Objective:   Physical Exam        Assessment & Plan:  Left without being seen

## 2013-04-22 NOTE — Progress Notes (Signed)
UA-sp gr-1.010, protein-neg, blood-neg glucose-neg

## 2013-05-05 ENCOUNTER — Ambulatory Visit: Payer: Self-pay | Admitting: Family Medicine

## 2013-05-05 VITALS — BP 102/70 | HR 96 | Temp 98.0°F | Resp 16 | Ht 69.5 in | Wt 339.0 lb

## 2013-05-05 DIAGNOSIS — G4733 Obstructive sleep apnea (adult) (pediatric): Secondary | ICD-10-CM

## 2013-05-05 DIAGNOSIS — Z0289 Encounter for other administrative examinations: Secondary | ICD-10-CM

## 2013-05-05 DIAGNOSIS — F32A Depression, unspecified: Secondary | ICD-10-CM

## 2013-05-05 DIAGNOSIS — E785 Hyperlipidemia, unspecified: Secondary | ICD-10-CM

## 2013-05-05 DIAGNOSIS — F329 Major depressive disorder, single episode, unspecified: Secondary | ICD-10-CM

## 2013-05-05 DIAGNOSIS — I10 Essential (primary) hypertension: Secondary | ICD-10-CM

## 2013-05-05 DIAGNOSIS — I509 Heart failure, unspecified: Secondary | ICD-10-CM

## 2013-05-05 DIAGNOSIS — Z9989 Dependence on other enabling machines and devices: Secondary | ICD-10-CM

## 2013-05-05 NOTE — Progress Notes (Signed)
Subjective:    Patient ID: [name], male    DOB: [birth year], 68 y.o.   MRN: [id number] This chart was scribed for Ethelda Chick, MD by Valera Castle, ED Scribe. This patient was seen in room 04 and the patient's care was started at 12:14 PM.  No chief complaint on file.  HPI [name] is a 68 y.o. male Pt presents for a DOT physical. His last DOT physical was here 06/17/12. Was seen 06/24/12 and 09/20/12 by vascular surgery secondary to venous insufficiency. Vascular studies showed gross results right great Saphenous system by Dr. Josephina Gip. Pt has h/o HTN, sleep apnea, CHF, hyperlipidemia, enlarged prostate, glaucoma, depression.   CPAP - 32 total used days. Average daily use 6:20. Median daily usage 6:34. 25 days used > 4 hours. 7 days used < 4 hours. % greater than 4 hours was 78%.   Today: Pt denies h/o CHF, stating it was fluid around his lungs, 10 years ago, diagnosed here, and put on fluid pills. He reports being on depression medication since 2002, Synbiax by Dr. Providence Lanius. He reports seeing Dr. Providence Lanius once a year, since 2014. He reports his medication is working well. He denies HI, SI, self injury. He reports admitting himself 2002 at [facility], seen there for 1 week.   He reports back surgery lower back 2002, c-spine 2003. He reports Relafen for chronic back pain. He reports h/o right foot surgery over his heel, 1995. He reports 2 day hospitalization for trouble breathing.  He reports being adopted. He denies any brothers and sisters. He reports being married 46 years, 1 child, 1 grandchild. He works for [employer], drives to [place], [place], twice a week. He states he has been driving 47 years. He reports quitting smoking when he was 68 y.o. He denies EtOH use, drug use. He denies exercising. He denies h/o DM, MI, stroke, seizure.   He reports h/o neck stiffness due to dual rod placement. He reports intermittent bowel movement, at most 2 days without. He reports  intermittent numbness, tingling in his feet. He reports his hearing in his left ear is gradually decreasing. He denies ringing in his ears. He denies headaches, dizziness, double vision, blurred vision, chest pain, palpitations, persistent cough, numbness or tingling in arms, bladder incontinence, and any other associated symptoms.   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 He reports having to get up and out of his truck. He states he has a partner, 67 y.o. Who drives with him. 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   Dr. Providence Lanius - [facility] PCP - Devra Dopp, MD  Patient Active Problem List   Diagnosis Date Noted  . Unspecified venous (peripheral) insufficiency 07/04/2012  . Swelling of limb 07/04/2012  . Morbid obesity 06/17/2012  . HTN (hypertension) 06/17/2012  . Depression 06/17/2012    Past Surgical History  Procedure Laterality Date  . Right heel       (congenital birth defect)  . Spine surgery  03/23/2000    03/23/2001.  Lumbar and cervical spine.  . Foot surgery  03/23/1993    R subtalar joint  . Behavioral health admission  03/23/2000    admission x 7 days.  [facility].   No Known Allergies Prior to Admission medications   Medication Sig Start Date End Date Taking? Authorizing Provider  aspirin 81 MG tablet Take 81 mg by mouth daily.   Yes Historical Provider, MD  atorvastatin (LIPITOR)  20 MG tablet Take 20 mg by mouth daily.   Yes Historical Provider, MD  doxazosin (CARDURA) 8 MG tablet Take 8 mg by mouth 2 (two) times daily.   Yes Historical Provider, MD  furosemide (LASIX) 40 MG tablet Take 40 mg by mouth 2 (two) times daily.   Yes Historical Provider, MD  latanoprost (XALATAN) 0.005 % ophthalmic solution 1 drop at bedtime.   Yes Historical Provider, MD  nabumetone (RELAFEN) 750 MG tablet Take 750 mg by mouth 2 (two) times daily.   Yes Historical Provider, MD  olanzapine-FLUoxetine (SYMBYAX) 6-50 MG per capsule Take 1 capsule by mouth every  evening.   Yes Historical Provider, MD  spironolactone (ALDACTONE) 50 MG tablet Take 50 mg by mouth 3 (three) times a week.   Yes Historical Provider, MD   Review of Systems  Constitutional: Negative for fever, chills, diaphoresis, activity change, appetite change, fatigue and unexpected weight change.  HENT: Negative for congestion, dental problem, drooling, ear discharge, ear pain, facial swelling, hearing loss, mouth sores, nosebleeds, postnasal drip, rhinorrhea, sinus pressure, sneezing, sore throat, tinnitus, trouble swallowing and voice change.   Eyes: Negative for photophobia, pain, discharge, redness, itching and visual disturbance.  Respiratory: Negative for apnea, cough, choking, chest tightness, shortness of breath, wheezing and stridor.   Cardiovascular: Negative for chest pain, palpitations and leg swelling.  Gastrointestinal: Negative for nausea, vomiting, abdominal pain, diarrhea, constipation and blood in stool.  Endocrine: Negative for cold intolerance, heat intolerance, polydipsia, polyphagia and polyuria.  Genitourinary: Negative.  Negative for dysuria, urgency, frequency, hematuria, flank pain, decreased urine volume, discharge, penile swelling, scrotal swelling, enuresis, difficulty urinating, genital sores, penile pain and testicular pain.       Negative for bladder incontinence   Musculoskeletal: Positive for back pain. Negative for myalgias, joint swelling, arthralgias, gait problem, neck pain and neck stiffness.  Skin: Negative for color change, pallor, rash and wound.  Allergic/Immunologic: Negative for environmental allergies, food allergies and immunocompromised state.  Neurological: Negative for dizziness, tremors, seizures, syncope, facial asymmetry, speech difficulty, weakness, light-headedness, numbness and headaches.  Hematological: Negative for adenopathy. Does not bruise/bleed easily.  Psychiatric/Behavioral: Negative for suicidal ideas, hallucinations, behavioral  problems, confusion, sleep disturbance, self-injury, dysphoric mood, decreased concentration and agitation. The patient is not nervous/anxious and is not hyperactive.       Objective:   Physical Exam  Constitutional: He is oriented to person, place, and time. He appears well-developed and well-nourished. No distress.  Obese  HENT:  Head: Normocephalic and atraumatic.  Right Ear: Tympanic membrane, external ear and ear canal normal.  Left Ear: Tympanic membrane, external ear and ear canal normal.  Nose: Nose normal.  Mouth/Throat: Oropharynx is clear and moist. Mucous membranes are dry.  Eyes: Conjunctivae and EOM are normal. Pupils are equal, round, and reactive to light.  Neck: Normal range of motion. Neck supple. No thyromegaly present.  Cardiovascular: Normal rate, regular rhythm, normal heart sounds and intact distal pulses.  Exam reveals no gallop and no friction rub.   No murmur heard. No pitting edema. DP intact.   Pulmonary/Chest: Effort normal and breath sounds normal. No respiratory distress. He has no wheezes. He has no rales.  Abdominal: Soft. Bowel sounds are normal. He exhibits no distension and no mass. There is no tenderness. There is no rebound and no guarding. Hernia confirmed negative in the right inguinal area and confirmed negative in the left inguinal area.  Genitourinary: Prostate normal, testes normal and penis normal. No penile tenderness.  Musculoskeletal:  He exhibits no edema.       Right shoulder: Normal.       Left shoulder: Normal.       Right elbow: Normal.      Left elbow: Normal.       Right wrist: Normal.       Left wrist: Normal.       Cervical back: He exhibits decreased range of motion. He exhibits no tenderness, no bony tenderness and no pain.       Thoracic back: Normal.       Lumbar back: Normal. He exhibits normal range of motion, no tenderness, no bony tenderness and no pain.  Lymphadenopathy:    He has no cervical adenopathy.  Neurological:  He is alert and oriented to person, place, and time. He has normal strength and normal reflexes. He displays normal reflexes. No cranial nerve deficit or sensory deficit. He exhibits normal muscle tone. He displays a negative Romberg sign. Coordination and gait normal.  Reflex Scores:      Patellar reflexes are 2+ on the right side and 2+ on the left side. Strength normal all extremities. Sensation intact.  Skin: Skin is warm and dry.  Psychiatric: He has a normal mood and affect. His behavior is normal.  Nursing note and vitals reviewed.  BP 102/70  Pulse 96  Temp(Src) 98 F (36.7 C) (Oral)  Resp 16  Ht 5' 9.5" (1.765 m)  Wt 339 lb (153.769 kg)  BMI 49.36 kg/m2  SpO2 96%     Assessment & Plan:  Health examination of defined subpopulation  OSA on CPAP  Essential hypertension  Hyperlipidemia  Depression  Congestive heart failure, unspecified congestive heart failure chronicity, unspecified congestive heart failure type   1. DOT CPE:  Will not provide with DOT card at this time until patient undergoes 2D-echo to evaluate EF.  Documentation in old paper chart of CHF; thus, cannot clear to drive until cardiology consultation and clearance.  DOT card does not expire for another month.  2.  OSA on CPAP: controlled; good compliance with CPAP. 3.  HTN: controlled; clearance to drive based on BP. 4.  Hyperlipidemia: controlled. 5.  Depression: stable; well controlled on current regimen; history of mental health admission over ten years ago. 6.  CHF: documented in old [facility] chart; recommend referral to cardiology to undergo 2D-echo and stress testing if warranted.  Pt expressed understanding.   I personally performed the services described in this documentation, which was scribed in my presence.  The recorded information has been reviewed and is accurate.  Nilda SimmerKristi Glenola Wheat, M.D.  [facility]  [place] [address] [place], [place]  [id number] [phone]  phone [phone] fax

## 2013-06-19 ENCOUNTER — Ambulatory Visit: Payer: Self-pay | Admitting: Family Medicine

## 2013-06-19 ENCOUNTER — Encounter: Payer: Self-pay | Admitting: Family Medicine

## 2013-06-19 VITALS — BP 120/74 | HR 69 | Temp 98.1°F | Resp 18 | Ht 70.0 in | Wt 334.8 lb

## 2013-06-19 DIAGNOSIS — I5030 Unspecified diastolic (congestive) heart failure: Secondary | ICD-10-CM

## 2013-06-19 DIAGNOSIS — Z0289 Encounter for other administrative examinations: Secondary | ICD-10-CM

## 2013-06-19 DIAGNOSIS — I10 Essential (primary) hypertension: Secondary | ICD-10-CM

## 2013-06-19 NOTE — Progress Notes (Signed)
Subjective:    Patient ID: [name], male    DOB: 10/29/1945, 89 y.o.   MRN: [id number]  06/19/2013  Follow-up   HPI This 68 y.o. male presents for six week follow-up for DOT CPE:  1.  CHF:  S/[facility].  S/p cardiolite that was low-risk for ischemia.  S/p echocardiogram that revealed a normal ejection fraction without acute abnormality yet study was limited by body habitus; diastolic function was unable to be adequately assessed.  No changes to treatment plan recommended other than weight loss.    Plans to retire in the upcoming year; [place]     Review of Systems  Respiratory: Negative for cough and shortness of breath.   Cardiovascular: Positive for leg swelling. Negative for chest pain and palpitations.    Past Medical History  Diagnosis Date  . Hyperlipidemia   . Hypertension   . Sleep apnea, obstructive     on CPAP  . BPH (benign prostatic hyperplasia)   . Glaucoma   . Cataract   . Arthritis   . Anxiety   . Depression 03/24/2003    maintained on Symbyax.  Hospitalization in 2002 for one week.   . CHF (congestive heart failure)     echo 06/12/13 normal EF; diastolic function unable to be assesed.  Cardiolite 06/12/13 low risk for ischemia. [facility].   No Known Allergies Current Outpatient Prescriptions  Medication Sig Dispense Refill  . aspirin 81 MG tablet Take 81 mg by mouth daily.      Marland Kitchen. atorvastatin (LIPITOR) 20 MG tablet Take 20 mg by mouth daily.      Marland Kitchen. doxazosin (CARDURA) 8 MG tablet Take 8 mg by mouth 2 (two) times daily.      . furosemide (LASIX) 40 MG tablet Take 40 mg by mouth 2 (two) times daily.      . nabumetone (RELAFEN) 750 MG tablet Take 750 mg by mouth 2 (two) times daily.      Marland Kitchen. olanzapine-FLUoxetine (SYMBYAX) 6-50 MG per capsule Take 1 capsule by mouth every evening.      Marland Kitchen. spironolactone (ALDACTONE) 50 MG tablet Take 50 mg by mouth 3 (three) times a week.      .  latanoprost (XALATAN) 0.005 % ophthalmic solution 1 drop at bedtime.       No current facility-administered medications for this visit.   History   Social History  . Marital Status: Married    Spouse Name: N/A    Number of Children: N/A  . Years of Education: N/A   Occupational History  . Not on file.   Social History Main Topics  . Smoking status: Never Smoker   . Smokeless tobacco: Former NeurosurgeonUser    Types: Chew    Quit date: 10/03/1972  . Alcohol Use: No  . Drug Use: No  . Sexual Activity: Not on file   Other Topics Concern  . 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  Truck driver x 47 years.      Tobacco:  No tobacco.      Alcohol: none now      Drugs: none      Exercise: none       Objective:  BP 120/74  Pulse 69  Temp(Src) 98.1 F (36.7 C) (Oral)  Resp 18  Ht 5\' 10"  (1.778 m)  Wt 334 lb 12.8 oz (151.864 kg)  BMI 48.04 kg/m2  SpO2 96% Physical Exam  Nursing note and vitals reviewed. Constitutional: He is oriented to person, place, and time. He appears well-developed and well-nourished. No distress.  Obese.  HENT:  Head: Normocephalic and atraumatic.  Eyes: Conjunctivae are normal. Pupils are equal, round, and reactive to light.  Cardiovascular: Normal rate and regular rhythm.  Exam reveals no gallop and no friction rub.   No murmur heard. 1+ pitting edema BLE.  Pulmonary/Chest: Effort normal and breath sounds normal. He has no wheezes. He has no rales.  Neurological: He is alert and oriented to person, place, and time.  Skin: He is not diaphoretic.  Psychiatric: He has a normal mood and affect. His behavior is normal.       Assessment & Plan:  Essential hypertension, benign  Diastolic CHF  1. CHF:  Stable; s/p echo and cardiolite with normal ejection fracture; one year DOT card  provided.  Clearance to drive.  No orders of the defined types were placed in this encounter.    No Follow-up on file.  Nilda Simmer, M.D.  [facility] [address] [place], [place]  [address] [phone] phone [phone] fax

## 2014-02-06 ENCOUNTER — Encounter: Payer: Self-pay | Admitting: Podiatry

## 2014-02-06 ENCOUNTER — Ambulatory Visit (INDEPENDENT_AMBULATORY_CARE_PROVIDER_SITE_OTHER): Payer: Managed Care, Other (non HMO) | Admitting: Podiatry

## 2014-02-06 VITALS — BP 103/51 | HR 72 | Resp 16

## 2014-02-06 DIAGNOSIS — B351 Tinea unguium: Secondary | ICD-10-CM

## 2014-02-06 DIAGNOSIS — M79673 Pain in unspecified foot: Secondary | ICD-10-CM

## 2014-02-06 DIAGNOSIS — E1142 Type 2 diabetes mellitus with diabetic polyneuropathy: Secondary | ICD-10-CM

## 2014-02-06 NOTE — Progress Notes (Signed)
   Subjective:    Patient ID: [name], male    DOB: 08/08/1945, 68 y.o.   MRN: [id number]  HPI Comments: "He needs his feet checked and the nails"  Patient states that he needs the toenails cut and his feet checked. His skin is scaly and dark. He was a Naval architecttruck driver for many years.     Review of Systems  Constitutional: Positive for fatigue.  HENT: Positive for tinnitus.   Cardiovascular: Positive for leg swelling.  Musculoskeletal: Positive for myalgias, back pain, arthralgias and gait problem.  Hematological: Bruises/bleeds easily.  Psychiatric/Behavioral: The patient is nervous/anxious.   All other systems reviewed and are negative.      Objective:   Physical Exam: I have reviewed his past medical history medications allergies or social history. Pulses are strongly palpable bilateral mild edema bilateral. Neurologic sensorium is decreased per Semmes-Weinstein monofilament bilateral. Deep tendon reflexes are intact bilateral muscle strength +5 over 5 dorsiflexion plantar flexors and inverters everters all intrinsic musculature is intact. Orthopedic evaluation does demonstrate mild pes planus hallux valgus and tailor's bunion deformities with hammertoe deformities bilateral. All of this is asymmetric. Cutaneous evaluation demonstrates supple well-hydrated cutis mild dry xerotic margins otherwise nail plates are thick yellow dystrophic and clinically mycotic.        Assessment & Plan:  Assessment: Nail dystrophy. Pain limb secondary to diabetic neuropathy. Pain in limb secondary to onychomycosis bilateral.  Plan: Debridement of nails 1 through 5 bilateral covered service secondary to pain.

## 2014-02-06 NOTE — Patient Instructions (Signed)
Diabetes and Foot Care Diabetes may cause you to have problems because of poor blood supply (circulation) to your feet and legs. This may cause the skin on your feet to become thinner, break easier, and heal more slowly. Your skin may become dry, and the skin may peel and crack. You may also have nerve damage in your legs and feet causing decreased feeling in them. You may not notice minor injuries to your feet that could lead to infections or more serious problems. Taking care of your feet is one of the most important things you can do for yourself.  HOME CARE INSTRUCTIONS  Wear shoes at all times, even in the house. Do not go barefoot. Bare feet are easily injured.  Check your feet daily for blisters, cuts, and redness. If you cannot see the bottom of your feet, use a mirror or ask someone for help.  Wash your feet with warm water (do not use hot water) and mild soap. Then pat your feet and the areas between your toes until they are completely dry. Do not soak your feet as this can dry your skin.  Apply a moisturizing lotion or petroleum jelly (that does not contain alcohol and is unscented) to the skin on your feet and to dry, brittle toenails. Do not apply lotion between your toes.  Trim your toenails straight across. Do not dig under them or around the cuticle. File the edges of your nails with an emery board or nail file.  Do not cut corns or calluses or try to remove them with medicine.  Wear clean socks or stockings every day. Make sure they are not too tight. Do not wear knee-high stockings since they may decrease blood flow to your legs.  Wear shoes that fit properly and have enough cushioning. To break in new shoes, wear them for just a few hours a day. This prevents you from injuring your feet. Always look in your shoes before you put them on to be sure there are no objects inside.  Do not cross your legs. This may decrease the blood flow to your feet.  If you find a minor scrape,  cut, or break in the skin on your feet, keep it and the skin around it clean and dry. These areas may be cleansed with mild soap and water. Do not cleanse the area with peroxide, alcohol, or iodine.  When you remove an adhesive bandage, be sure not to damage the skin around it.  If you have a wound, look at it several times a day to make sure it is healing.  Do not use heating pads or hot water bottles. They may burn your skin. If you have lost feeling in your feet or legs, you may not know it is happening until it is too late.  Make sure your health care provider performs a complete foot exam at least annually or more often if you have foot problems. Report any cuts, sores, or bruises to your health care provider immediately. SEEK MEDICAL CARE IF:   You have an injury that is not healing.  You have cuts or breaks in the skin.  You have an ingrown nail.  You notice redness on your legs or feet.  You feel burning or tingling in your legs or feet.  You have pain or cramps in your legs and feet.  Your legs or feet are numb.  Your feet always feel cold. SEEK IMMEDIATE MEDICAL CARE IF:   There is increasing redness,   swelling, or pain in or around a wound.  There is a red line that goes up your leg.  Pus is coming from a wound.  You develop a fever or as directed by your health care provider.  You notice a bad smell coming from an ulcer or wound. Document Released: 03/06/2000 Document Revised: 11/09/2012 Document Reviewed: 08/16/2012 ExitCare Patient Information 2015 [employer]. This information is not intended to replace advice given to you by your health care provider. Make sure you discuss any questions you have with your health care provider.  

## 2014-03-24 ENCOUNTER — Ambulatory Visit (INDEPENDENT_AMBULATORY_CARE_PROVIDER_SITE_OTHER): Payer: Self-pay | Admitting: Family Medicine

## 2014-03-24 VITALS — BP 134/82 | HR 84 | Temp 97.8°F | Resp 20 | Ht 71.0 in | Wt 240.4 lb

## 2014-03-24 DIAGNOSIS — Z029 Encounter for administrative examinations, unspecified: Secondary | ICD-10-CM

## 2014-03-24 DIAGNOSIS — E785 Hyperlipidemia, unspecified: Secondary | ICD-10-CM

## 2014-03-24 DIAGNOSIS — I10 Essential (primary) hypertension: Secondary | ICD-10-CM

## 2014-03-24 DIAGNOSIS — G4733 Obstructive sleep apnea (adult) (pediatric): Secondary | ICD-10-CM

## 2014-03-24 DIAGNOSIS — Z9989 Dependence on other enabling machines and devices: Secondary | ICD-10-CM

## 2014-03-24 DIAGNOSIS — Z024 Encounter for examination for driving license: Secondary | ICD-10-CM

## 2014-03-24 NOTE — Progress Notes (Signed)
DOT physical examination  Physical examination: 69 year old man who is here for his DOT physical examination. He is doing fairly well. He is only driving a day or 2 a week. Earlier this past year he had his full Cardiologic workup. His echocardiogram was good and the nuclear medicine scan was also not abnormal, though it was not a very good scan due to his weight. He does have sleep apnea, and wears his CPAP faithfully. He generally is driving around the local area rather than cross country now days. He is married.  Past medical history: Operations: He has had cervical spinal stenosis surgery and lumbar surgery also. He has had a screw put his foot. No other major operations. Illnesses: He was thought to have some congestive changes are his heart, but the echocardiogram does not indicate that. He does have a history of hypertension and obstructive sleep apnea and insomni anxiety and depression and hyperlipidemia Medications: See list Allergies: None known  Social history: As noted above he is a Naval architect, married. Does not smoke or drink or use drugs.  Review of systems: Constitutional energy level is pretty good, moving more HEENT: Wears glasses. No other complaints Cardiovascular: Unremarkable Respiratory: Shortness of breath on exertion GI: Unremarkable Urinary: Unremarkable Muscular skeletal: As above Dermatologic: Unremarkable: Neurologic: Unremarkable Gastric: History of anxiety and depression  Physical examination: Large, obese man in no major distress. His TMs are normal. Eyes PERRLA. EOMs intact. Throat clear. Neck supple without nodes thyromegaly. No carotid bruits. Chest clear to auscultation. Heart regular without murmurs gallops or arrhythmias. Abdomen soft without mass or tenderness. Normal male external genitalia, testes descended, no hernias. Extremities have some edema. He wears orthopedic shoes.  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 We will scan the report in the record.  When your card due to the sleep apnea and hypertension.

## 2014-05-10 ENCOUNTER — Ambulatory Visit: Payer: Managed Care, Other (non HMO)

## 2014-05-11 ENCOUNTER — Ambulatory Visit: Payer: Managed Care, Other (non HMO) | Admitting: Podiatry

## 2014-05-18 ENCOUNTER — Ambulatory Visit (INDEPENDENT_AMBULATORY_CARE_PROVIDER_SITE_OTHER): Payer: Managed Care, Other (non HMO) | Admitting: Podiatry

## 2014-05-18 ENCOUNTER — Encounter: Payer: Self-pay | Admitting: Podiatry

## 2014-05-18 DIAGNOSIS — B351 Tinea unguium: Secondary | ICD-10-CM

## 2014-05-18 DIAGNOSIS — M79673 Pain in unspecified foot: Secondary | ICD-10-CM

## 2014-05-18 NOTE — Patient Instructions (Signed)
Diabetes and Foot Care Diabetes may cause you to have problems because of poor blood supply (circulation) to your feet and legs. This may cause the skin on your feet to become thinner, break easier, and heal more slowly. Your skin may become dry, and the skin may peel and crack. You may also have nerve damage in your legs and feet causing decreased feeling in them. You may not notice minor injuries to your feet that could lead to infections or more serious problems. Taking care of your feet is one of the most important things you can do for yourself.  HOME CARE INSTRUCTIONS  Wear shoes at all times, even in the house. Do not go barefoot. Bare feet are easily injured.  Check your feet daily for blisters, cuts, and redness. If you cannot see the bottom of your feet, use a mirror or ask someone for help.  Wash your feet with warm water (do not use hot water) and mild soap. Then pat your feet and the areas between your toes until they are completely dry. Do not soak your feet as this can dry your skin.  Apply a moisturizing lotion or petroleum jelly (that does not contain alcohol and is unscented) to the skin on your feet and to dry, brittle toenails. Do not apply lotion between your toes.  Trim your toenails straight across. Do not dig under them or around the cuticle. File the edges of your nails with an emery board or nail file.  Do not cut corns or calluses or try to remove them with medicine.  Wear clean socks or stockings every day. Make sure they are not too tight. Do not wear knee-high stockings since they may decrease blood flow to your legs.  Wear shoes that fit properly and have enough cushioning. To break in new shoes, wear them for just a few hours a day. This prevents you from injuring your feet. Always look in your shoes before you put them on to be sure there are no objects inside.  Do not cross your legs. This may decrease the blood flow to your feet.  If you find a minor scrape,  cut, or break in the skin on your feet, keep it and the skin around it clean and dry. These areas may be cleansed with mild soap and water. Do not cleanse the area with peroxide, alcohol, or iodine.  When you remove an adhesive bandage, be sure not to damage the skin around it.  If you have a wound, look at it several times a day to make sure it is healing.  Do not use heating pads or hot water bottles. They may burn your skin. If you have lost feeling in your feet or legs, you may not know it is happening until it is too late.  Make sure your health care provider performs a complete foot exam at least annually or more often if you have foot problems. Report any cuts, sores, or bruises to your health care provider immediately. SEEK MEDICAL CARE IF:   You have an injury that is not healing.  You have cuts or breaks in the skin.  You have an ingrown nail.  You notice redness on your legs or feet.  You feel burning or tingling in your legs or feet.  You have pain or cramps in your legs and feet.  Your legs or feet are numb.  Your feet always feel cold. SEEK IMMEDIATE MEDICAL CARE IF:   There is increasing redness,   swelling, or pain in or around a wound.  There is a red line that goes up your leg.  Pus is coming from a wound.  You develop a fever or as directed by your health care provider.  You notice a bad smell coming from an ulcer or wound. Document Released: 03/06/2000 Document Revised: 11/09/2012 Document Reviewed: 08/16/2012 ExitCare Patient Information 2015 [employer]. This information is not intended to replace advice given to you by your health care provider. Make sure you discuss any questions you have with your health care provider.  

## 2014-05-18 NOTE — Progress Notes (Signed)
Patient ID: Stephen PortelaRobert [name], male   DOB: 02/06/1946, 69 y.o.   MRN: [id number]  Subjective: 69 y.o.-year-old male returns the office today for painful, elongated, thickened toenails which he is unable to cut himself. Denies any redness or drainage around the nails. Denies any acute changes since last appointment and no new complaints today. States he blood sugar has been "good". Denies any systemic complaints such as fevers, chills, nausea, vomiting.   Objective: AAO 3, NAD DP/PT pulses palpable, CRT less than 3 seconds Protective sensation intact with Simms Weinstein monofilament, Achilles tendon reflex intact.  Nails hypertrophic, dystrophic, elongated, brittle, discolored 10. There is tenderness upon palpation of nails 1-5 bilaterally. There is no surrounding erythema or drainage along the nail sites. No open lesions or pre-ulcerative lesions are identified. No other areas of tenderness bilateral lower extremities. No overlying edema, erythema, increased warmth. No pain with calf compression, swelling, warmth, erythema.  Assessment: Patient presents with symptomatic onychomycosis  Plan: -Treatment options including alternatives, risks, complications were discussed -Nails sharply debrided 10 without complication/bleeding. -Discussed daily foot inspection. If there are any changes, to call the office immediately.  -Follow-up in 3 months or sooner if any problems are to arise. In the meantime, encouraged to call the office with any questions, concerns, changes symptoms.

## 2014-08-17 ENCOUNTER — Ambulatory Visit (INDEPENDENT_AMBULATORY_CARE_PROVIDER_SITE_OTHER): Payer: Managed Care, Other (non HMO) | Admitting: Podiatry

## 2014-08-17 DIAGNOSIS — E1142 Type 2 diabetes mellitus with diabetic polyneuropathy: Secondary | ICD-10-CM | POA: Diagnosis not present

## 2014-08-17 DIAGNOSIS — M79673 Pain in unspecified foot: Secondary | ICD-10-CM

## 2014-08-17 DIAGNOSIS — B351 Tinea unguium: Secondary | ICD-10-CM | POA: Diagnosis not present

## 2014-08-17 NOTE — Addendum Note (Signed)
Addended by: Helane GuntherMAYER, Cloyce Blankenhorn on: 08/17/2014 09:36 AM   Modules accepted: Level of Service

## 2014-08-17 NOTE — Progress Notes (Signed)
This encounter was created in error - please disregard.

## 2014-08-17 NOTE — Progress Notes (Deleted)
Patient ID: Stephen PortelaRobert T Arnold, male   DOB: 10/01/1945, 69 y.o.   MRN: [id number] Complaint:  Visit Type: Patient returns to my office for continued preventative foot care services. Complaint: Patient states" my nails have grown long and thick and become painful to walk and wear shoes" Patient has been diagnosed with DM with no complications. He presents for preventative foot care services. No changes to ROS  Podiatric Exam: Vascular: dorsalis pedis and posterior tibial pulses are not palpable bilateral. Capillary return is immediate. Temperature gradient is WNL. Skin turgor WNL  Sensorium: Normal Semmes Weinstein monofilament test. Normal tactile sensation bilaterally. Nail Exam: Pt has thick disfigured discolored nails with subungual debris noted bilateral entire nail hallux through fifth toenails Ulcer Exam: There is no evidence of ulcer or pre-ulcerative changes or infection. Orthopedic Exam: Muscle tone and strength are WNL. No limitations in general ROM. No crepitus or effusions noted. Foot type and digits show no abnormalities. Bony prominences are unremarkable.   Adducto-varus fifth digit B/L .  Bone spur fifth toe right foot. Skin: No Porokeratosis. No infection or ulcers  Diagnosis:  Tinea unguium, Pain in right toe, pain in left toes  Treatment & Plan Procedures and Treatment: Consent by patient was obtained for treatment procedures. The patient understood the discussion of treatment and procedures well. All questions were answered thoroughly reviewed. Debridement of mycotic and hypertrophic toenails, 1 through 5 bilateral and clearing of subungual debris. No ulceration, no infection noted. Debride corn fifth digit right foot.  Return Visit-Office Procedure: Patient instructed to return to the office for a follow up visit 3 months for continued evaluation and treatment.

## 2014-08-17 NOTE — Progress Notes (Addendum)
Patient ID: Stephen PortelaRobert [name], male   DOB: 09/20/1945, 69 y.o.   MRN: [id number] Complaint:  Visit Type: Patient returns to my office for continued preventative foot care services. Complaint: Patient states" my nails have grown long and thick and become painful to walk and wear shoes" Patient has been diagnosed with DM with no foot  complications. He presents for preventative foot care services. No changes to ROS  Podiatric Exam: Vascular: dorsalis pedis and posterior tibial pulses are palpable bilateral. Capillary return is immediate. Temperature gradient is WNL. Skin turgor WNL  Sensorium: Normal Semmes Weinstein monofilament test. Normal tactile sensation bilaterally. Nail Exam: Pt has thick disfigured discolored nails with subungual debris noted bilateral entire nail hallux through fifth toenails Ulcer Exam: There is no evidence of ulcer or pre-ulcerative changes or infection. Orthopedic Exam: Muscle tone and strength are WNL. No limitations in general ROM. No crepitus or effusions noted. Foot type and digits show no abnormalities. Bony prominences are unremarkable. Skin: No Porokeratosis. No infection or ulcers  Diagnosis:  Tinea unguium, Pain in right toe, pain in left toes  Treatment & Plan Procedures and Treatment: Consent by patient was obtained for treatment procedures. The patient understood the discussion of treatment and procedures well. All questions were answered thoroughly reviewed. Debridement of mycotic and hypertrophic toenails, 1 through 5 bilateral and clearing of subungual debris. No ulceration, no infection noted.  Return Visit-Office Procedure: Patient instructed to return to the office for a follow up visit 3 months for continued evaluation and treatment.

## 2014-08-17 NOTE — Addendum Note (Signed)
Addended by: Helane GuntherMAYER, Baron Parmelee on: 08/17/2014 09:15 AM   Modules accepted: Medications

## 2014-08-17 NOTE — Addendum Note (Signed)
Addended by: Helane GuntherMAYER, Facundo Allemand on: 08/17/2014 09:06 AM   Modules accepted: Level of Service, SmartSet

## 2014-09-17 ENCOUNTER — Other Ambulatory Visit: Payer: Self-pay

## 2014-11-29 ENCOUNTER — Ambulatory Visit: Payer: Managed Care, Other (non HMO) | Admitting: Podiatry

## 2014-12-07 ENCOUNTER — Ambulatory Visit (INDEPENDENT_AMBULATORY_CARE_PROVIDER_SITE_OTHER): Payer: Medicare HMO | Admitting: Podiatry

## 2014-12-07 ENCOUNTER — Encounter: Payer: Self-pay | Admitting: Podiatry

## 2014-12-07 DIAGNOSIS — E1142 Type 2 diabetes mellitus with diabetic polyneuropathy: Secondary | ICD-10-CM

## 2014-12-07 DIAGNOSIS — M79673 Pain in unspecified foot: Secondary | ICD-10-CM

## 2014-12-07 DIAGNOSIS — B351 Tinea unguium: Secondary | ICD-10-CM | POA: Diagnosis not present

## 2014-12-07 NOTE — Progress Notes (Signed)
Patient ID: SEVEN DOLLENS, male   DOB: 01/28/1946, 69 y.o.   MRN: [id number] Complaint:  Visit Type: Patient returns to my office for continued preventative foot care services. Complaint: Patient states" my nails have grown long and thick and become painful to walk and wear shoes" Patient has been diagnosed with DM with no foot complications. The patient presents for preventative foot care services. No changes to ROS  Podiatric Exam: Vascular: dorsalis pedis and posterior tibial pulses are not  palpable bilateral due to swelling. Capillary return is immediate. Temperature gradient is WNL. Skin turgor WNL  Sensorium: Diminished  Semmes Weinstein monofilament test. Normal tactile sensation bilaterally. Nail Exam: Pt has thick disfigured discolored nails with subungual debris noted bilateral entire nail hallux through fifth toenails Ulcer Exam: There is no evidence of ulcer or pre-ulcerative changes or infection. Orthopedic Exam: Muscle tone and strength are WNL. No limitations in general ROM. No crepitus or effusions noted. Foot type and digits show no abnormalities. Bony prominences are unremarkable. Severe hallux limitus 1st MPJ B/L. Skin: No Porokeratosis. No infection or ulcers  Diagnosis:  Onychomycosis, , Pain in right toe, pain in left toes  Treatment & Plan Procedures and Treatment: Consent by patient was obtained for treatment procedures. The patient understood the discussion of treatment and procedures well. All questions were answered thoroughly reviewed. Debridement of mycotic and hypertrophic toenails, 1 through 5 bilateral and clearing of subungual debris. No ulceration, no infection noted.  Return Visit-Office Procedure: Patient instructed to return to the office for a follow up visit 3 months for continued evaluation and treatment.

## 2015-01-14 ENCOUNTER — Emergency Department (HOSPITAL_BASED_OUTPATIENT_CLINIC_OR_DEPARTMENT_OTHER)
Admission: EM | Admit: 2015-01-14 | Discharge: 2015-01-15 | Disposition: A | Discharge status: Home / Self Care | Payer: Medicare HMO | Attending: Emergency Medicine | Admitting: Emergency Medicine

## 2015-01-14 ENCOUNTER — Emergency Department (HOSPITAL_BASED_OUTPATIENT_CLINIC_OR_DEPARTMENT_OTHER): Payer: Medicare HMO

## 2015-01-14 ENCOUNTER — Encounter (HOSPITAL_BASED_OUTPATIENT_CLINIC_OR_DEPARTMENT_OTHER): Payer: Self-pay | Admitting: *Deleted

## 2015-01-14 DIAGNOSIS — Y9289 Other specified places as the place of occurrence of the external cause: Secondary | ICD-10-CM | POA: Insufficient documentation

## 2015-01-14 DIAGNOSIS — S065X0A Traumatic subdural hemorrhage without loss of consciousness, initial encounter: Secondary | ICD-10-CM | POA: Diagnosis not present

## 2015-01-14 DIAGNOSIS — Y998 Other external cause status: Secondary | ICD-10-CM | POA: Diagnosis not present

## 2015-01-14 DIAGNOSIS — E119 Type 2 diabetes mellitus without complications: Secondary | ICD-10-CM | POA: Insufficient documentation

## 2015-01-14 DIAGNOSIS — I509 Heart failure, unspecified: Secondary | ICD-10-CM | POA: Insufficient documentation

## 2015-01-14 DIAGNOSIS — M1990 Unspecified osteoarthritis, unspecified site: Secondary | ICD-10-CM | POA: Insufficient documentation

## 2015-01-14 DIAGNOSIS — Y9389 Activity, other specified: Secondary | ICD-10-CM | POA: Diagnosis not present

## 2015-01-14 DIAGNOSIS — Z8659 Personal history of other mental and behavioral disorders: Secondary | ICD-10-CM | POA: Diagnosis not present

## 2015-01-14 DIAGNOSIS — Z7982 Long term (current) use of aspirin: Secondary | ICD-10-CM | POA: Insufficient documentation

## 2015-01-14 DIAGNOSIS — W010XXA Fall on same level from slipping, tripping and stumbling without subsequent striking against object, initial encounter: Secondary | ICD-10-CM | POA: Diagnosis not present

## 2015-01-14 DIAGNOSIS — Z79899 Other long term (current) drug therapy: Secondary | ICD-10-CM | POA: Diagnosis not present

## 2015-01-14 DIAGNOSIS — E785 Hyperlipidemia, unspecified: Secondary | ICD-10-CM | POA: Diagnosis not present

## 2015-01-14 DIAGNOSIS — G4733 Obstructive sleep apnea (adult) (pediatric): Secondary | ICD-10-CM | POA: Diagnosis not present

## 2015-01-14 DIAGNOSIS — I6200 Nontraumatic subdural hemorrhage, unspecified: Secondary | ICD-10-CM

## 2015-01-14 DIAGNOSIS — I10 Essential (primary) hypertension: Secondary | ICD-10-CM | POA: Diagnosis not present

## 2015-01-14 DIAGNOSIS — S0993XA Unspecified injury of face, initial encounter: Secondary | ICD-10-CM | POA: Diagnosis present

## 2015-01-14 DIAGNOSIS — S0081XA Abrasion of other part of head, initial encounter: Secondary | ICD-10-CM | POA: Insufficient documentation

## 2015-01-14 HISTORY — DX: Type 2 diabetes mellitus without complications: E11.9

## 2015-01-14 LAB — BASIC METABOLIC PANEL
ANION GAP: 4 — AB (ref 5–15)
BUN: 19 mg/dL (ref 6–20)
CHLORIDE: 102 mmol/L (ref 101–111)
CO2: 33 mmol/L — ABNORMAL HIGH (ref 22–32)
CREATININE: 1.15 mg/dL (ref 0.61–1.24)
Calcium: 8.6 mg/dL — ABNORMAL LOW (ref 8.9–10.3)
GFR calc non Af Amer: >60 mL/min (ref >60)
Glucose, Bld: 107 mg/dL — ABNORMAL HIGH (ref 65–99)
Potassium: 4.2 mmol/L (ref 3.5–5.1)
SODIUM: 139 mmol/L (ref 135–145)

## 2015-01-14 LAB — CBC WITH DIFFERENTIAL/PLATELET
BASOS ABS: 0 10*3/uL (ref 0.0–0.1)
BASOS PCT: 0 %
EOS ABS: 0.3 10*3/uL (ref 0.0–0.7)
Eosinophils Relative: 3 %
HEMATOCRIT: 35.8 % — AB (ref 39.0–52.0)
HEMOGLOBIN: 11.1 g/dL — AB (ref 13.0–17.0)
Lymphocytes Relative: 18 %
Lymphs Abs: 1.5 10*3/uL (ref 0.7–4.0)
MCH: 30.9 pg (ref 26.0–34.0)
MCHC: 31 g/dL (ref 30.0–36.0)
MCV: 99.7 fL (ref 78.0–100.0)
MONOS PCT: 11 %
Monocytes Absolute: 0.9 10*3/uL (ref 0.1–1.0)
NEUTROS ABS: 5.7 10*3/uL (ref 1.7–7.7)
NEUTROS PCT: 68 %
Platelets: 150 10*3/uL (ref 150–400)
RBC: 3.59 MIL/uL — AB (ref 4.22–5.81)
RDW: 14.1 % (ref 11.5–15.5)
WBC: 8.4 10*3/uL (ref 4.0–10.5)

## 2015-01-14 LAB — PROTIME-INR
INR: 0.93 (ref 0.00–1.49)
PROTHROMBIN TIME: 12.7 s (ref 11.6–15.2)

## 2015-01-14 LAB — APTT: APTT: 23 s — AB (ref 24–37)

## 2015-01-14 MED ORDER — OXYMETAZOLINE HCL 0.05 % NA SOLN
1.0000 | Freq: Once | NASAL | Status: AC
  Administered 2015-01-14: 1 via NASAL
  Filled 2015-01-14: qty 15

## 2015-01-14 NOTE — ED Provider Notes (Signed)
CSN: [id number]     Arrival date & time 01/14/15  1857 History   First MD Initiated Contact with Patient 01/14/15 1912     No chief complaint on file.    (Consider location/radiation/quality/duration/timing/severity/associated sxs/prior Treatment) HPI [name] is a 69 y.o. male presents to emergency department complaining of a fall. Patient states he leaned over to pick up the paper in a driveway and states he lost balance and fell face forward. He denies getting dizzy, lightheaded, having chest pain, shortness of breath, tripping. He states that he hit his face on the concrete. Denies any other injuries. No loss of consciousness. Patient is able to walk. He denies any injury to the arms or legs. No neck pain. Denies any liver issues, denies any nausea, vomiting, confusion. He does not take any blood thinners other than 81 mg aspirin daily. He reports nosebleed that he was able to partially control with pressure. Denies changes in vision.  Past Medical History  Diagnosis Date  . Hyperlipidemia   . Hypertension   . Sleep apnea, obstructive     on CPAP  . BPH (benign prostatic hyperplasia)   . Glaucoma   . Cataract   . Arthritis   . Anxiety   . Depression 03/24/2003    maintained on Symbyax.  Hospitalization in 2002 for one week.   . CHF (congestive heart failure) (HCC)     echo 06/12/13 normal EF; diastolic function unable to be assesed.  Cardiolite 06/12/13 low risk for ischemia. [facility].  . Diabetes mellitus without complication [facility])    Past Surgical History  Procedure Laterality Date  . Right heel       (congenital birth defect)  . Spine surgery  03/23/2000    03/23/2001.  Lumbar and cervical spine.  . Foot surgery  03/23/1993    R subtalar joint  . Behavioral health admission  03/23/2000    admission x 7 days.  [facility].   Family History  Problem Relation Age of Onset  . Adopted: Yes   Social History  Substance Use Topics  . Smoking status: Never  Smoker   . Smokeless tobacco: Former Neurosurgeon    Types: Chew    Quit date: 10/03/1972  . Alcohol Use: No    Review of Systems  Constitutional: Negative for fever and chills.  Eyes: Negative for photophobia and visual disturbance.  Respiratory: Negative for cough, chest tightness and shortness of breath.   Cardiovascular: Negative for chest pain, palpitations and leg swelling.  Gastrointestinal: Negative for nausea, vomiting, abdominal pain, diarrhea and abdominal distention.  Musculoskeletal: Negative for myalgias, arthralgias, neck pain and neck stiffness.  Skin: Negative for rash.  Allergic/Immunologic: Negative for immunocompromised state.  Neurological: Positive for headaches. Negative for dizziness, syncope, weakness, light-headedness and numbness.      Allergies  Review of patient's allergies indicates no known allergies.  Home Medications   Prior to Admission medications   Medication Sig Start Date End Date Taking? Authorizing Provider  aspirin 81 MG tablet Take 81 mg by mouth daily.   Yes Historical Provider, MD  atorvastatin (LIPITOR) 20 MG tablet Take 20 mg by mouth daily.   Yes Historical Provider, MD  Brimonidine Tartrate-Timolol (COMBIGAN OP) Apply to eye.   Yes Historical Provider, MD  furosemide (LASIX) 40 MG tablet Take 40 mg by mouth 2 (two) times daily.   Yes Historical Provider, MD  metFORMIN (GLUCOPHAGE) 500 MG tablet Take by mouth 2 (two) times daily with a meal.  Yes Historical Provider, MD  Multiple Vitamin (MULTIVITAMIN) capsule Take 1 capsule by mouth daily.   Yes Historical Provider, MD  olanzapine-FLUoxetine (SYMBYAX) 6-50 MG per capsule Take 1 capsule by mouth every evening.   Yes Historical Provider, MD  oxyCODONE-acetaminophen (PERCOCET) 10-325 MG per tablet Take 1 tablet by mouth. 07/31/14  Yes Historical Provider, MD   BP 131/64 mmHg  Pulse 80  Temp(Src) 98.2 F (36.8 C) (Oral)  Resp 20  SpO2 100% Physical Exam  Constitutional: He appears  well-developed and well-nourished. No distress.  HENT:  Head: Normocephalic.  Right Ear: External ear normal.  Left Ear: External ear normal.  Mouth/Throat: Oropharynx is clear and moist.  Abrasion to the right forehead, bridge of the nose, distal nose, right maxilla. Hemostatic. Blood in bilateral nares, no active hemorrhage. No hemotympanum bilaterally.  Eyes: Conjunctivae and EOM are normal. Pupils are equal, round, and reactive to light.  Neck: Normal range of motion. Neck supple.  No midline tenderness  Cardiovascular: Normal rate, regular rhythm and normal heart sounds.   Pulmonary/Chest: Effort normal. No respiratory distress. He has no wheezes. He has no rales.  Abdominal: Soft. Bowel sounds are normal. He exhibits no distension. There is no tenderness. There is no rebound.  Musculoskeletal: He exhibits no edema.  Full range of motion of bilateral upper and lower extremities.  Neurological: He is alert.  Skin: Skin is warm and dry.  Nursing note and vitals reviewed.   ED Course  Procedures (including critical care time) Labs Review Labs Reviewed  CBC WITH DIFFERENTIAL/PLATELET - Abnormal; Notable for the following:    RBC 3.59 (*)    Hemoglobin 11.1 (*)    HCT 35.8 (*)    All other components within normal limits  BASIC METABOLIC PANEL - Abnormal; Notable for the following:    CO2 33 (*)    Glucose, Bld 107 (*)    Calcium 8.6 (*)    Anion gap 4 (*)    All other components within normal limits  APTT - Abnormal; Notable for the following:    aPTT 23 (*)    All other components within normal limits  PROTIME-INR    Imaging Review Ct Head Wo Contrast  01/14/2015  CLINICAL DATA:  69 year old male with history of trauma from a fall earlier today after losing his balance. Patient reportedly fell face first onto the cement driveway. Laceration above the right eye. Abrasion on the nose. No associated loss of consciousness. EXAM: CT HEAD WITHOUT CONTRAST CT MAXILLOFACIAL  WITHOUT CONTRAST CT CERVICAL SPINE WITHOUT CONTRAST TECHNIQUE: Multidetector CT imaging of the head, cervical spine, and maxillofacial structures were performed using the standard protocol without intravenous contrast. Multiplanar CT image reconstructions of the cervical spine and maxillofacial structures were also generated. COMPARISON:  No priors. FINDINGS: CT HEAD FINDINGS Small amount of high attenuation measuring 6 mm in thickness adjacent to the falx cerebri to the left of midline medial to the left frontal lobe, compatible with a small subdural hematoma. No intraparenchymal or intraventricular hemorrhage. No hydrocephalus. Patchy and confluent areas of decreased attenuation are noted throughout the deep and periventricular white matter of the cerebral hemispheres bilaterally, compatible with chronic microvascular ischemic disease. No mass effect or midline shift. No acute displaced skull fractures. Visualized paranasal sinuses and mastoids are well pneumatized, with exception of a small mucosal retention cyst or polyp in the left maxillary sinus. CT MAXILLOFACIAL FINDINGS No acute displaced facial bone fractures. Specifically, pterygoid plates are intact. The mandible is intact, and the  mandibular condyles are located bilaterally. Bilateral globes and retro-orbital soft tissues are grossly normal. Small mucosal retention cyst or polyp in left maxillary sinus incidentally noted. CT CERVICAL SPINE FINDINGS Postoperative changes of laminectomy and posterior rod and screw fixation are noted from C3-C7. Alignment is anatomic. Prevertebral soft tissues are normal. No acute displaced fractures of the cervical spine. Severe multilevel degenerative disc disease, most pronounced at C3-C4, C4-C5, C5-C6 and C6-C7. Visualized portions of the upper thorax are unremarkable. IMPRESSION: 1. Small subdural hematoma measuring up to 6 mm in thickness along the left side of the falx cerebria in the left frontal region. No  significant mass effect at this time. No other acute intracranial findings at this time. 2. No facial fractures or cervical spine fractures. 3. Postoperative changes and chronic degenerative findings in the cervical spine, as above. Critical Value/emergent results were called by telephone at the time of interpretation on 01/14/2015 at 8:40 pm to PA [facility] Oluwatoni Rotunno, who verbally acknowledged these results. Electronically Signed   By: Trudie Reedaniel  Entrikin M.D.   On: 01/14/2015 20:45   Ct Cervical Spine Wo Contrast  01/14/2015  CLINICAL DATA:  69 year old male with history of trauma from a fall earlier today after losing his balance. Patient reportedly fell face first onto the cement driveway. Laceration above the right eye. Abrasion on the nose. No associated loss of consciousness. EXAM: CT HEAD WITHOUT CONTRAST CT MAXILLOFACIAL WITHOUT CONTRAST CT CERVICAL SPINE WITHOUT CONTRAST TECHNIQUE: Multidetector CT imaging of the head, cervical spine, and maxillofacial structures were performed using the standard protocol without intravenous contrast. Multiplanar CT image reconstructions of the cervical spine and maxillofacial structures were also generated. COMPARISON:  No priors. FINDINGS: CT HEAD FINDINGS Small amount of high attenuation measuring 6 mm in thickness adjacent to the falx cerebri to the left of midline medial to the left frontal lobe, compatible with a small subdural hematoma. No intraparenchymal or intraventricular hemorrhage. No hydrocephalus. Patchy and confluent areas of decreased attenuation are noted throughout the deep and periventricular white matter of the cerebral hemispheres bilaterally, compatible with chronic microvascular ischemic disease. No mass effect or midline shift. No acute displaced skull fractures. Visualized paranasal sinuses and mastoids are well pneumatized, with exception of a small mucosal retention cyst or polyp in the left maxillary sinus. CT MAXILLOFACIAL FINDINGS No acute  displaced facial bone fractures. Specifically, pterygoid plates are intact. The mandible is intact, and the mandibular condyles are located bilaterally. Bilateral globes and retro-orbital soft tissues are grossly normal. Small mucosal retention cyst or polyp in left maxillary sinus incidentally noted. CT CERVICAL SPINE FINDINGS Postoperative changes of laminectomy and posterior rod and screw fixation are noted from C3-C7. Alignment is anatomic. Prevertebral soft tissues are normal. No acute displaced fractures of the cervical spine. Severe multilevel degenerative disc disease, most pronounced at C3-C4, C4-C5, C5-C6 and C6-C7. Visualized portions of the upper thorax are unremarkable. IMPRESSION: 1. Small subdural hematoma measuring up to 6 mm in thickness along the left side of the falx cerebria in the left frontal region. No significant mass effect at this time. No other acute intracranial findings at this time. 2. No facial fractures or cervical spine fractures. 3. Postoperative changes and chronic degenerative findings in the cervical spine, as above. Critical Value/emergent results were called by telephone at the time of interpretation on 01/14/2015 at 8:40 pm [facility] Buc[facility], who verbally acknowledged these results. Electronically Signed   By: Trudie Reedaniel  Entrikin M.D.   On: 01/14/2015 20:45   Ct Maxillofacial Wo Cm  01/14/2015  CLINICAL DATA:  69 year old male with history of trauma from a fall earlier today after losing his balance. Patient reportedly fell face first onto the cement driveway. Laceration above the right eye. Abrasion on the nose. No associated loss of consciousness. EXAM: CT HEAD WITHOUT CONTRAST CT MAXILLOFACIAL WITHOUT CONTRAST CT CERVICAL SPINE WITHOUT CONTRAST TECHNIQUE: Multidetector CT imaging of the head, cervical spine, and maxillofacial structures were performed using the standard protocol without intravenous contrast. Multiplanar CT image reconstructions of the cervical spine  and maxillofacial structures were also generated. COMPARISON:  No priors. FINDINGS: CT HEAD FINDINGS Small amount of high attenuation measuring 6 mm in thickness adjacent to the falx cerebri to the left of midline medial to the left frontal lobe, compatible with a small subdural hematoma. No intraparenchymal or intraventricular hemorrhage. No hydrocephalus. Patchy and confluent areas of decreased attenuation are noted throughout the deep and periventricular white matter of the cerebral hemispheres bilaterally, compatible with chronic microvascular ischemic disease. No mass effect or midline shift. No acute displaced skull fractures. Visualized paranasal sinuses and mastoids are well pneumatized, with exception of a small mucosal retention cyst or polyp in the left maxillary sinus. CT MAXILLOFACIAL FINDINGS No acute displaced facial bone fractures. Specifically, pterygoid plates are intact. The mandible is intact, and the mandibular condyles are located bilaterally. Bilateral globes and retro-orbital soft tissues are grossly normal. Small mucosal retention cyst or polyp in left maxillary sinus incidentally noted. CT CERVICAL SPINE FINDINGS Postoperative changes of laminectomy and posterior rod and screw fixation are noted from C3-C7. Alignment is anatomic. Prevertebral soft tissues are normal. No acute displaced fractures of the cervical spine. Severe multilevel degenerative disc disease, most pronounced at C3-C4, C4-C5, C5-C6 and C6-C7. Visualized portions of the upper thorax are unremarkable. IMPRESSION: 1. Small subdural hematoma measuring up to 6 mm in thickness along the left side of the falx cerebria in the left frontal region. No significant mass effect at this time. No other acute intracranial findings at this time. 2. No facial fractures or cervical spine fractures. 3. Postoperative changes and chronic degenerative findings in the cervical spine, as above. Critical Value/emergent results were called by  telephone at the time of interpretation on 01/14/2015 at 8:40 pm to PA [facility], who verbally acknowledged these results. Electronically Signed   By: Trudie Reed M.D.   On: 01/14/2015 20:45    I have personally reviewed and evaluated these images and lab results as part of my medical decision-making.   EKG Interpretation None      MDM   Final diagnoses:  Subdural bleeding (HCC)  Facial abrasion, initial encounter    patient with facial injury after mechanical fall earlier today. He has multiple facial abrasions and pain to the right maxilla. Normal neurological exam otherwise. Will get CT head, cervical spine, maxillofacial. Patient states his tetanus up-to-date. All lacerations and abrasions cleaned with saline.   Patient's CT showed small, 6 mm subdural hemorrhage. This finding was discussed with Dr. Lovell Sheehan, neurosurgery, by Dr. Cyndie Chime. He advised that it is appropriate for this patient to be treated outpatient given the size of the bleed and the fact the patient is not anticoagulated. He continues to have normal neurological exam while in the emergency department. I discussed treatment plan with patient and he agrees. Patient states that he would rather go home. Both him and his wife voiced understanding of the return precautions. Patient will follow with Dr. Lovell Sheehan in one week. I advised him to follow with primary care doctor  just for recheck in 2 days.  Filed Vitals:   01/14/15 1858 01/14/15 2116 01/14/15 2321  BP: 131/64 138/88 126/70  Pulse: 80 79 76  Temp: 98.2 F (36.8 C)    TempSrc: Oral    Resp: SpO2: 100% 95% 96%     Jaynie Crumble, PA-C 01/15/15 0115  Leta Baptist, MD 01/15/15 0230

## 2015-01-14 NOTE — ED Notes (Signed)
States he lost balance attempting to pick paper out of driveway. Feel face first into cement driveway. Pt has laceration above right eye and abrasion to nose. No other injury. No LOC.

## 2015-01-14 NOTE — Discharge Instructions (Signed)
Tylenol for pain. Bacitracin twice a day to the abrasions. Follow up with your doctor for recheck in 2 days. Follow up with neurosurgery in 1 week.    Subdural Hematoma Evacuation Subdural hematoma evacuation is a procedure used to treat a collection of blood (blood clot) between your brain and its tough outer covering (dura). This is caused by bleeding (hemorrhage) from a torn vein. Subdural hematoma evacuation is sometimes done for bleeding that develops slowly, over weeks or months (chronic subdural hematoma). The procedure may be needed if the bleeding becomes dangerous or presses on the brain. It is also sometimes done as an emergency procedure after a head injury (acute subdural hematoma). During the procedure, the skull is opened, and the blood clot is gently flushed away using a saline (balanced salt) solution. LET [facility] PROVIDER KNOW ABOUT:   Any allergies you have.   All medicines you are taking, including vitamins, herbs, eye drops, creams, and over-the-counter medicines.   Use of steroids (by mouth or creams).   Previous problems you or members of your family have had with the use of anesthetics.   Any blood disorders you have, including a history of blood clots (thrombophlebitis).   Previous surgeries you have had.   Possibility of pregnancy, if this applies.   Medical conditions you have.  RISKS AND COMPLICATIONS  Generally, subdural hematoma evacuation is a safe procedure. However, as with any procedure, complications can occur. Possible complications include:  Infection.   Allergic reaction to the anesthetic or other medicines given.   Bleeding after surgery.   Seizures from brain irritability.   Brain damage.  Brain swelling.  Stroke.  BEFORE THE PROCEDURE   Do not eat or drink anything for 24 hours before the procedure.  Ask your health care provider about changing or stopping your regular medicines. PROCEDURE   You will be  given a medicine to make you sleep through the procedure (general anesthetic).   Your scalp will be shaved and cleaned at the site where the skull will be opened.   Small holes (burr holes) are carefully drilled into your skull. A bone saw is then used to connect the burr holes until a section of your skull can be removed. This is called a bone flap.   After the bone flap is removed, the blood clot is removed from your brain using saline or other solutions.  Your surgeon may place a drain inside your skull to remove blood or fluids that can collect after surgery.   After the procedure is finished, the bone flap is usually replaced. Sometimes there is too much brain swelling to replace the bone, especially if you are being treated for an acute subdural hematoma. In this case, either the bone flap is saved in a special storage area ("bone bank") or a pocket is created on your abdomen, and the bone is placed there. The scalp is then stitched back together.  If the bone flapis not replaced at the time of your evacuation surgery, it will be replaced later, during another surgery. This will happen when your surgeon determines that the brain swelling has decreased enough to make replacement of the bone flap safe. AFTER THE PROCEDURE   You will stay in a recovery area until you are stable. Your progress will be watched closely.   Once you are stable enough to move, you will go to a regular hospital room or an intensive care unit, depending on your condition.   Depending on your  progress, your hospital stay may vary from a few days to several weeks.   This information is not intended to replace advice given to you by your health care provider. Make sure you discuss any questions you have with your health care provider.   Document Released: 01/04/2009 Document Revised: 11/09/2012 Document Reviewed: 08/29/2012 Elsevier Interactive Patient Education [employer]

## 2015-01-15 MED ORDER — ACETAMINOPHEN 500 MG PO TABS
1000.0000 mg | ORAL_TABLET | Freq: Once | ORAL | Status: AC
  Administered 2015-01-15: 1000 mg via ORAL
  Filled 2015-01-15: qty 2

## 2015-02-28 ENCOUNTER — Encounter: Payer: Self-pay | Admitting: Podiatry

## 2015-02-28 ENCOUNTER — Ambulatory Visit (INDEPENDENT_AMBULATORY_CARE_PROVIDER_SITE_OTHER): Payer: Medicare HMO | Admitting: Podiatry

## 2015-02-28 DIAGNOSIS — M79673 Pain in unspecified foot: Secondary | ICD-10-CM | POA: Diagnosis not present

## 2015-02-28 DIAGNOSIS — B351 Tinea unguium: Secondary | ICD-10-CM | POA: Diagnosis not present

## 2015-02-28 DIAGNOSIS — E1142 Type 2 diabetes mellitus with diabetic polyneuropathy: Secondary | ICD-10-CM

## 2015-02-28 NOTE — Addendum Note (Signed)
Addended by: Harlon FlorSOUTHERLAND, Aria Pickrell L on: 02/28/2015 10:20 AM   Modules accepted: Orders, Medications

## 2015-02-28 NOTE — Progress Notes (Signed)
Patient ID: Stephen PortelaRobert [name], male   DOB: 04/06/1945, 69 y.o.   MRN: [id number] Complaint:  Visit Type: Patient returns to my office for continued preventative foot care services. Complaint: Patient states" my nails have grown long and thick and become painful to walk and wear shoes" Patient has been diagnosed with DM with no foot complications. The patient presents for preventative foot care services. No changes to ROS  Podiatric Exam: Vascular: dorsalis pedis and posterior tibial pulses are not  palpable bilateral due to swelling. Capillary return is immediate. Temperature gradient is WNL. Skin turgor WNL  Sensorium: Diminished  Semmes Weinstein monofilament test. Normal tactile sensation bilaterally. Nail Exam: Pt has thick disfigured discolored nails with subungual debris noted bilateral entire nail hallux through fifth toenails Ulcer Exam: There is no evidence of ulcer or pre-ulcerative changes or infection. Orthopedic Exam: Muscle tone and strength are WNL. No limitations in general ROM. No crepitus or effusions noted. Foot type and digits show no abnormalities. Bony prominences are unremarkable. 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 Skin: No Porokeratosis. No infection or ulcers  Diagnosis:  Onychomycosis, , Pain in right toe, pain in left toes  Treatment & Plan Procedures and Treatment: Consent by patient was obtained for treatment procedures. The patient understood the discussion of treatment and procedures well. All questions were answered thoroughly reviewed. Debridement of mycotic and hypertrophic toenails, 1 through 5 bilateral and clearing of subungual debris. No ulceration, no infection noted.  Return Visit-Office Procedure: Patient instructed to return to the office for a follow up visit 3 months for continued evaluation and treatment.  Helane GuntherGregory Ivone Licht DPM

## 2015-03-01 ENCOUNTER — Ambulatory Visit: Payer: Medicare HMO | Admitting: Podiatry

## 2015-04-04 ENCOUNTER — Other Ambulatory Visit: Payer: Self-pay | Admitting: Rehabilitation

## 2015-04-04 DIAGNOSIS — M9963 Osseous and subluxation stenosis of intervertebral foramina of lumbar region: Secondary | ICD-10-CM

## 2015-04-12 ENCOUNTER — Ambulatory Visit
Admission: RE | Admit: 2015-04-12 | Discharge: 2015-04-12 | Disposition: A | Discharge status: Home / Self Care | Payer: Medicare HMO | Source: Ambulatory Visit | Attending: Rehabilitation | Admitting: Rehabilitation

## 2015-04-12 DIAGNOSIS — M9963 Osseous and subluxation stenosis of intervertebral foramina of lumbar region: Secondary | ICD-10-CM

## 2015-04-12 MED ORDER — GADOBENATE DIMEGLUMINE 529 MG/ML IV SOLN
20.0000 mL | Freq: Once | INTRAVENOUS | Status: AC | PRN
  Administered 2015-04-12: 20 mL via INTRAVENOUS

## 2015-04-24 ENCOUNTER — Other Ambulatory Visit: Payer: Self-pay | Admitting: Orthopaedic Surgery

## 2015-04-24 DIAGNOSIS — M546 Pain in thoracic spine: Secondary | ICD-10-CM

## 2015-04-25 ENCOUNTER — Ambulatory Visit
Admission: RE | Admit: 2015-04-25 | Discharge: 2015-04-25 | Disposition: A | Discharge status: Home / Self Care | Payer: Medicare HMO | Source: Ambulatory Visit | Attending: Orthopaedic Surgery | Admitting: Orthopaedic Surgery

## 2015-04-25 DIAGNOSIS — M546 Pain in thoracic spine: Secondary | ICD-10-CM

## 2015-05-13 ENCOUNTER — Encounter (HOSPITAL_BASED_OUTPATIENT_CLINIC_OR_DEPARTMENT_OTHER): Payer: Self-pay | Admitting: Emergency Medicine

## 2015-05-13 ENCOUNTER — Emergency Department (HOSPITAL_BASED_OUTPATIENT_CLINIC_OR_DEPARTMENT_OTHER): Payer: Medicare HMO

## 2015-05-13 ENCOUNTER — Emergency Department (HOSPITAL_BASED_OUTPATIENT_CLINIC_OR_DEPARTMENT_OTHER)
Admission: EM | Admit: 2015-05-13 | Discharge: 2015-05-13 | Disposition: A | Discharge status: Home / Self Care | Payer: Medicare HMO | Attending: Emergency Medicine | Admitting: Emergency Medicine

## 2015-05-13 DIAGNOSIS — F419 Anxiety disorder, unspecified: Secondary | ICD-10-CM | POA: Diagnosis not present

## 2015-05-13 DIAGNOSIS — Z7984 Long term (current) use of oral hypoglycemic drugs: Secondary | ICD-10-CM | POA: Insufficient documentation

## 2015-05-13 DIAGNOSIS — M1990 Unspecified osteoarthritis, unspecified site: Secondary | ICD-10-CM | POA: Diagnosis not present

## 2015-05-13 DIAGNOSIS — S59902A Unspecified injury of left elbow, initial encounter: Secondary | ICD-10-CM | POA: Diagnosis present

## 2015-05-13 DIAGNOSIS — Y998 Other external cause status: Secondary | ICD-10-CM | POA: Diagnosis not present

## 2015-05-13 DIAGNOSIS — M25561 Pain in right knee: Secondary | ICD-10-CM

## 2015-05-13 DIAGNOSIS — I509 Heart failure, unspecified: Secondary | ICD-10-CM | POA: Insufficient documentation

## 2015-05-13 DIAGNOSIS — W108XXA Fall (on) (from) other stairs and steps, initial encounter: Secondary | ICD-10-CM | POA: Diagnosis not present

## 2015-05-13 DIAGNOSIS — Y9289 Other specified places as the place of occurrence of the external cause: Secondary | ICD-10-CM | POA: Insufficient documentation

## 2015-05-13 DIAGNOSIS — G8929 Other chronic pain: Secondary | ICD-10-CM | POA: Diagnosis not present

## 2015-05-13 DIAGNOSIS — S80212A Abrasion, left knee, initial encounter: Secondary | ICD-10-CM | POA: Diagnosis not present

## 2015-05-13 DIAGNOSIS — Z7982 Long term (current) use of aspirin: Secondary | ICD-10-CM | POA: Insufficient documentation

## 2015-05-13 DIAGNOSIS — H269 Unspecified cataract: Secondary | ICD-10-CM | POA: Insufficient documentation

## 2015-05-13 DIAGNOSIS — I10 Essential (primary) hypertension: Secondary | ICD-10-CM | POA: Diagnosis not present

## 2015-05-13 DIAGNOSIS — Z79899 Other long term (current) drug therapy: Secondary | ICD-10-CM | POA: Diagnosis not present

## 2015-05-13 DIAGNOSIS — S51002A Unspecified open wound of left elbow, initial encounter: Secondary | ICD-10-CM | POA: Diagnosis not present

## 2015-05-13 DIAGNOSIS — E785 Hyperlipidemia, unspecified: Secondary | ICD-10-CM | POA: Diagnosis not present

## 2015-05-13 DIAGNOSIS — Y9389 Activity, other specified: Secondary | ICD-10-CM | POA: Insufficient documentation

## 2015-05-13 DIAGNOSIS — H409 Unspecified glaucoma: Secondary | ICD-10-CM | POA: Insufficient documentation

## 2015-05-13 DIAGNOSIS — M545 Low back pain, unspecified: Secondary | ICD-10-CM

## 2015-05-13 DIAGNOSIS — E119 Type 2 diabetes mellitus without complications: Secondary | ICD-10-CM | POA: Diagnosis not present

## 2015-05-13 DIAGNOSIS — S3992XA Unspecified injury of lower back, initial encounter: Secondary | ICD-10-CM | POA: Diagnosis not present

## 2015-05-13 DIAGNOSIS — G4733 Obstructive sleep apnea (adult) (pediatric): Secondary | ICD-10-CM | POA: Insufficient documentation

## 2015-05-13 DIAGNOSIS — S51001A Unspecified open wound of right elbow, initial encounter: Secondary | ICD-10-CM | POA: Insufficient documentation

## 2015-05-13 DIAGNOSIS — F329 Major depressive disorder, single episode, unspecified: Secondary | ICD-10-CM | POA: Insufficient documentation

## 2015-05-13 DIAGNOSIS — Z87438 Personal history of other diseases of male genital organs: Secondary | ICD-10-CM | POA: Insufficient documentation

## 2015-05-13 DIAGNOSIS — S80211A Abrasion, right knee, initial encounter: Secondary | ICD-10-CM | POA: Diagnosis not present

## 2015-05-13 DIAGNOSIS — S51019A Laceration without foreign body of unspecified elbow, initial encounter: Secondary | ICD-10-CM

## 2015-05-13 DIAGNOSIS — W19XXXA Unspecified fall, initial encounter: Secondary | ICD-10-CM

## 2015-05-13 NOTE — Discharge Instructions (Signed)

## 2015-05-13 NOTE — ED Provider Notes (Signed)
CSN: [id number]     Arrival date & time 05/13/15  1850 History   First MD Initiated Contact with Patient 05/13/15 1903     Chief Complaint  Patient presents with  . Fall   HPI   70 year old male presents status post fall. Patient reports that he was taking out trash when he fell down 5 steps landing on his back. Patient reports that he struck the posterior aspect of the head, he denies any loss of consciousness. Patient reports a transient episode of difficulty speaking that quickly resolved. Patient has baseline chronic musculoskeletal issues including neck pain status post orthopedic hardware placement, chronic low back pain in need of surgical evaluation. Patient reports he ambulates at home but has difficulty due to back pain. She reports he was unable to get back to his feet after the fall due to worsening low back pain. Patient denies any preceding chest pain, shortness of breath, reports this was a mechanical fall.  Some mild evaluation patient reports that he has a frontal headache, posterior pain to the scalp with bleeding from that site. He reports acute on chronic right shoulder pain, denies reduced range of motion or distal strength or sensation deficits. Patient notes skin tears to the bilateral upper extremities, pain to the right knee and proximal tibia and superficial abrasions to the knees bilateral. She reports pain to the lumbar region, reports this is chronic in nature but slightly worsened over baseline. Patient denies any neurological deficits, strength deficits, or any other red flags for back pain. She is wife's present throughout the entire evaluation, reports the patient is at his baseline. Patient was offered pain medication here in the ED, he refused reporting you take his medications when he got home.  She denies any neck pain, full active range of motion, chest pain, shortness of breath, abdominal pain, nausea, vomiting, diaphoresis.  She does not currently taking any  blood thinners.     Past Medical History  Diagnosis Date  . Hyperlipidemia   . Hypertension   . Sleep apnea, obstructive     on CPAP  . BPH (benign prostatic hyperplasia)   . Glaucoma   . Cataract   . Arthritis   . Anxiety   . Depression 03/24/2003    maintained on Symbyax.  Hospitalization in 2002 for one week.   . CHF (congestive heart failure) (HCC)     echo 06/12/13 normal EF; diastolic function unable to be assesed.  Cardiolite 06/12/13 low risk for ischemia. [facility]  . Diabetes mellitus without complication [facility])    Past Surgical History  Procedure Laterality Date  . Right heel       (congenital birth defect)  . Spine surgery  03/23/2000    03/23/2001.  Lumbar and cervical spine.  . Foot surgery  03/23/1993    R subtalar joint  . Behavioral health admission  03/23/2000    admission x 7 days.  [facility].   Family History  Problem Relation Age of Onset  . Adopted: Yes   Social History  Substance Use Topics  . Smoking status: Never Smoker   . Smokeless tobacco: Former Neurosurgeon    Types: Chew    Quit date: 10/03/1972  . Alcohol Use: No    Review of Systems  All other systems reviewed and are negative.   Allergies  Review of patient's allergies indicates no known allergies.  Home Medications   Prior to Admission medications   Medication Sig Start Date End Date Taking?  Authorizing Provider  aspirin 81 MG tablet Take 81 mg by mouth daily.    Historical Provider, MD  atorvastatin (LIPITOR) 20 MG tablet TAKE 1 TABLET BY MOUTH ONCE DAILY 02/18/15   Historical Provider, MD  buPROPion (WELLBUTRIN XL) 150 MG 24 hr tablet TAKE ONE TABLET (150 MG TOTAL) BY MOUTH DAILY. 02/18/15   Historical Provider, MD  COMBIGAN 0.2-0.5 % ophthalmic solution APPLY 1 DROP INTO BOTH EYES TWICE A DAY 01/13/15   Historical Provider, MD  FLUZONE HIGH-DOSE 0.5 ML SUSY TO BE ADMINISTERED BY PHARMACIST FOR IMMUNIZATION 02/07/15   Historical Provider, MD  FREESTYLE LITE test  strip  01/13/15   Historical Provider, MD  furosemide (LASIX) 40 MG tablet TAKE 1 TABLET BY MOUTH TWICE A DAY 02/18/15   Historical Provider, MD  latanoprost (XALATAN) 0.005 % ophthalmic solution 1 drop. 05/23/14   Historical Provider, MD  lisinopril (PRINIVIL,ZESTRIL) 2.5 MG tablet TAKE 1 TABLET (2.5 MG TOTAL) BY MOUTH DAILY. 02/16/15   Historical Provider, MD  metFORMIN (GLUCOPHAGE-XR) 500 MG 24 hr tablet TAKE 1 TABLET BY MOUTH TWICE A DAY 02/18/15   Historical Provider, MD  olanzapine-FLUoxetine (SYMBYAX) 6-50 MG per capsule Take 1 capsule by mouth every evening.    Historical Provider, MD   BP 142/91 mmHg  Pulse 88  Temp(Src) 98.5 F (36.9 C) (Oral)  Resp 20  Ht 5\' 11"  (1.803 m)  Wt 151.955 kg  BMI 46.74 kg/m2  SpO2 94%    Physical Exam  Constitutional: He is oriented to person, place, and time. He appears well-developed and well-nourished.  HENT:  Head: Normocephalic.  Patient to the posterior scalp, no active bleeding, no lacerations. There is small amount of swelling noted, no obvious deformities of the skull  Eyes: Conjunctivae are normal. Pupils are equal, round, and reactive to light. Right eye exhibits no discharge. Left eye exhibits no discharge. No scleral icterus.  Neck: Normal range of motion. Neck supple. No JVD present. No tracheal deviation present.  Cardiovascular: Normal rate, regular rhythm, normal heart sounds and intact distal pulses.  Exam reveals no gallop and no friction rub.   No murmur heard. Pulmonary/Chest: Effort normal and breath sounds normal. No stridor. No respiratory distress. He has no wheezes. He has no rales. He exhibits no tenderness.  Nontender to palpation  Abdominal: Soft. He exhibits no distension and no mass. There is no tenderness. There is no rebound and no guarding.  Musculoskeletal:  No C spine tenderness to palpation, full active range of motion of the neck. No T or L-spine tenderness to palpation, significant tenderness to palpation of  the bilateral lower lumbar soft tissue, no obvious signs of trauma, swelling, edema, bruising.  Hips are stable, lower extremities nontender to palpation with the exception of the right knee and proximal tibia.  Patient ambulates without significant difficulty  Distal sensation strength or motor function intact  Neurological: He is alert and oriented to person, place, and time. Coordination normal.  Skin: Skin is warm and dry. No rash noted. No erythema. No pallor.  Skin tears to the bilateral upper extremities located over the elbow. Superficial abrasion to the bilateral knees.  Psychiatric: He has a normal mood and affect. His behavior is normal. Judgment and thought content normal.  Nursing note and vitals reviewed.   ED Course  Procedures (including critical care time) Labs Review Labs Reviewed - No data to display  Imaging Review Dg Lumbar Spine Complete  05/13/2015  CLINICAL DATA:  Larey Seat down 5 steps tonight landing on  back. Now with lumbosacral back pain radiating to the right. EXAM: LUMBAR SPINE - COMPLETE 4+ VIEW COMPARISON:  Lumbar spine MRI 04/12/2011 FINDINGS: No acute fracture or subluxation. Vertebral body heights are maintained. Large flowing anterior osteophytes multilevel facet arthropathy. Posterior elements are intact. Sacroiliac joints remain congruent. IMPRESSION: Degenerative change in the lumbar spine without acute fracture or subluxation. Electronically Signed   By: Rubye Oaks M.D.   On: 05/13/2015 21:04   Dg Shoulder Right  05/13/2015  CLINICAL DATA:  Larey Seat downstairs tonight. EXAM: RIGHT TIBIA AND FIBULA - 2 VIEW; RIGHT SHOULDER - 2+ VIEW COMPARISON:  None. FINDINGS: Right shoulder: Moderate to advanced AC joint and glenohumeral joint degenerative changes. No acute fractures identified. Marked narrowing of the humeroacromial space suggesting full-thickness rotator cuff tear. Right tibia/fibula: The knee and ankle joints are maintained. Moderate degenerative  changes. Remote postsurgical changes involving the right foot with subtalar fusion. Diffuse soft tissue calcifications are noted anteriorly. Extensive vascular calcifications. No acute fracture. IMPRESSION: 1. Moderate degenerative changes involving the right shoulder but no acute fracture. 2. Marked narrowing of the humeroacromial space suggesting full-thickness rotator cuff tear. 3. No acute fracture of the tibia/fibula. Electronically Signed   By: Rudie Meyer M.D.   On: 05/13/2015 21:08   Dg Tibia/fibula Right  05/13/2015  CLINICAL DATA:  Larey Seat downstairs tonight. EXAM: RIGHT TIBIA AND FIBULA - 2 VIEW; RIGHT SHOULDER - 2+ VIEW COMPARISON:  None. FINDINGS: Right shoulder: Moderate to advanced AC joint and glenohumeral joint degenerative changes. No acute fractures identified. Marked narrowing of the humeroacromial space suggesting full-thickness rotator cuff tear. Right tibia/fibula: The knee and ankle joints are maintained. Moderate degenerative changes. Remote postsurgical changes involving the right foot with subtalar fusion. Diffuse soft tissue calcifications are noted anteriorly. Extensive vascular calcifications. No acute fracture. IMPRESSION: 1. Moderate degenerative changes involving the right shoulder but no acute fracture. 2. Marked narrowing of the humeroacromial space suggesting full-thickness rotator cuff tear. 3. No acute fracture of the tibia/fibula. Electronically Signed   By: Rudie Meyer M.D.   On: 05/13/2015 21:08   Ct Head Wo Contrast  05/13/2015  CLINICAL DATA:  Initial evaluation for acute trauma, fall. EXAM: CT HEAD WITHOUT CONTRAST TECHNIQUE: Contiguous axial images were obtained from the base of the skull through the vertex without intravenous contrast. COMPARISON:  Prior study from 01/14/2015. FINDINGS: Minimal posterior scalp soft tissue swelling, consistent with contusion. Scalp soft tissues otherwise unremarkable. No acute abnormality about the orbits. Small retention cyst  within the left maxillary sinus. Paranasal sinuses are otherwise clear. No mastoid effusion. Calvarium intact. Generalized cerebral atrophy with mild chronic small vessel ischemic disease. No acute intracranial hemorrhage. No acute large vessel territory infarct. No mass lesion, midline shift, or mass effect. No hydrocephalus. No extra-axial fluid collection. IMPRESSION: 1. No acute intracranial process. 2. Small left posterior scalp contusion. 3. Mild atrophy with chronic small vessel ischemic disease. Electronically Signed   By: Rise Mu M.D.   On: 05/13/2015 21:01   Ct Cervical Spine Wo Contrast  05/13/2015  CLINICAL DATA:  Status post fall, striking back of head. Neck pain. Initial encounter. EXAM: CT CERVICAL SPINE WITHOUT CONTRAST TECHNIQUE: Multidetector CT imaging of the cervical spine was performed without intravenous contrast. Multiplanar CT image reconstructions were also generated. COMPARISON:  CT of the cervical spine performed 01/14/2015 FINDINGS: There is no evidence of fracture or subluxation. Vertebral bodies demonstrate normal height and alignment. Multilevel disc space narrowing and associated prominent anterior and posterior disc osteophyte complexes are noted  along the cervical spine. The patient is status post posterior cervical spinal fusion at C3-C7, with associated decompression. Prevertebral soft tissues are within normal limits. The visualized portions of the thyroid gland are unremarkable in appearance. The visualized lung apices are clear. Mild calcification is noted at the carotid bifurcations bilaterally. IMPRESSION: 1. No evidence of fracture or subluxation along the cervical spine. 2. Status post posterior cervical spinal fusion at C3-C7, with associated decompression. Underlying diffuse degenerative change noted. Electronically Signed   By: Roanna Raider M.D.   On: 05/13/2015 22:09   Dg Knee Complete 4 Views Right  05/13/2015  CLINICAL DATA:  Larey Seat down 5 steps  tonight, injuring right knee. Pain anteriorly. EXAM: RIGHT KNEE - COMPLETE 4+ VIEW COMPARISON:  None. FINDINGS: No fracture or dislocation. The alignment and joint spaces are maintained. Small patellar and medial tibial femoral osteophytes. Minimal spurring of tibial spines. Mild diffuse soft tissue edema. Vascular calcifications are seen. No significant joint effusion. IMPRESSION: No acute fracture or dislocation of the right knee. Mild osteoarthritis. Electronically Signed   By: Rubye Oaks M.D.   On: 05/13/2015 21:05   I have personally reviewed and evaluated these images and lab results as part of my medical decision-making.   EKG Interpretation None      MDM   Final diagnoses:  Fall, initial encounter  Skin tear of elbow without complication, unspecified laterality, initial encounter  Bilateral low back pain without sciatica  Right knee pain    Labs:  Imaging: CT head CT neck DG lumbar DG shoulder DG knee tib-fib- no significant findings  Consults:  Therapeutics:  Discharge Meds:    Assessment/Plan: 70 year old male status post fall. Patient has numerous abrasions and skin tears. Wounds were cleaned here in the ED, CT scan and plain films showed no significant acute findings. Patient was able to ambulate without significant difficulty. Patient has no significant findings on diagnostic or physical exam that would necessitate further evaluation or management here in the ED. This was a mechanical fall, patient will be going home with his wife, they're given strict return precautions, verbalized understanding and agreement for today's plan and had no further questions or concerns abdominal discharge         Eyvonne Mechanic, PA-C 05/13/15 2311  Lavera Guise, MD 05/14/15 [phone]

## 2015-05-13 NOTE — ED Notes (Signed)
Pt slipped down approximately 5 steps and hit his back and the back of this head.  Unknown LOC, had a few seconds of confusion, alert and oriented currently and neuro exam is WNL.  Pt has skin tears to left and right elbow and left wrist, abrasion to back of head and right knee.

## 2015-05-13 NOTE — ED Notes (Signed)
Pt verbalizes understanding of d/c instructions and denies any further needs at this time. 

## 2015-05-13 NOTE — ED Notes (Signed)
Per EMS:  Pt fell at home, tripped while going down steps.  Pt hit the back of his head on concrete.  No LOC.  Pt has skin tears to both elbows and right forearm.  Pt has chronic back pain.  Pt has hematoma to back of head.

## 2015-05-13 NOTE — ED Notes (Signed)
Patient transported to X-ray 

## 2015-05-30 ENCOUNTER — Ambulatory Visit: Payer: Medicare HMO | Admitting: Podiatry

## 2015-07-20 ENCOUNTER — Inpatient Hospital Stay (HOSPITAL_BASED_OUTPATIENT_CLINIC_OR_DEPARTMENT_OTHER): Payer: Medicare HMO

## 2015-07-20 ENCOUNTER — Encounter (HOSPITAL_BASED_OUTPATIENT_CLINIC_OR_DEPARTMENT_OTHER): Payer: Self-pay | Admitting: *Deleted

## 2015-07-20 ENCOUNTER — Other Ambulatory Visit (HOSPITAL_COMMUNITY): Payer: Medicare HMO

## 2015-07-20 ENCOUNTER — Inpatient Hospital Stay (HOSPITAL_COMMUNITY): Payer: Medicare HMO

## 2015-07-20 ENCOUNTER — Inpatient Hospital Stay (HOSPITAL_BASED_OUTPATIENT_CLINIC_OR_DEPARTMENT_OTHER)
Admission: EM | Admit: 2015-07-20 | Discharge: 2015-08-22 | DRG: 296 | Disposition: E | Payer: Medicare HMO | Attending: Pulmonary Disease | Admitting: Pulmonary Disease

## 2015-07-20 DIAGNOSIS — Z87891 Personal history of nicotine dependence: Secondary | ICD-10-CM

## 2015-07-20 DIAGNOSIS — I214 Non-ST elevation (NSTEMI) myocardial infarction: Secondary | ICD-10-CM | POA: Diagnosis not present

## 2015-07-20 DIAGNOSIS — I5032 Chronic diastolic (congestive) heart failure: Secondary | ICD-10-CM | POA: Diagnosis present

## 2015-07-20 DIAGNOSIS — E119 Type 2 diabetes mellitus without complications: Secondary | ICD-10-CM | POA: Diagnosis present

## 2015-07-20 DIAGNOSIS — E873 Alkalosis: Secondary | ICD-10-CM | POA: Diagnosis present

## 2015-07-20 DIAGNOSIS — D649 Anemia, unspecified: Secondary | ICD-10-CM | POA: Diagnosis present

## 2015-07-20 DIAGNOSIS — G4733 Obstructive sleep apnea (adult) (pediatric): Secondary | ICD-10-CM | POA: Diagnosis present

## 2015-07-20 DIAGNOSIS — R402312 Coma scale, best motor response, none, at arrival to emergency department: Secondary | ICD-10-CM | POA: Diagnosis present

## 2015-07-20 DIAGNOSIS — F418 Other specified anxiety disorders: Secondary | ICD-10-CM | POA: Diagnosis present

## 2015-07-20 DIAGNOSIS — Z66 Do not resuscitate: Secondary | ICD-10-CM | POA: Diagnosis present

## 2015-07-20 DIAGNOSIS — Z515 Encounter for palliative care: Secondary | ICD-10-CM | POA: Diagnosis not present

## 2015-07-20 DIAGNOSIS — R402112 Coma scale, eyes open, never, at arrival to emergency department: Secondary | ICD-10-CM | POA: Diagnosis present

## 2015-07-20 DIAGNOSIS — I469 Cardiac arrest, cause unspecified: Secondary | ICD-10-CM | POA: Diagnosis not present

## 2015-07-20 DIAGNOSIS — I472 Ventricular tachycardia: Secondary | ICD-10-CM | POA: Diagnosis present

## 2015-07-20 DIAGNOSIS — Z7982 Long term (current) use of aspirin: Secondary | ICD-10-CM

## 2015-07-20 DIAGNOSIS — Z4659 Encounter for fitting and adjustment of other gastrointestinal appliance and device: Secondary | ICD-10-CM

## 2015-07-20 DIAGNOSIS — G931 Anoxic brain damage, not elsewhere classified: Secondary | ICD-10-CM | POA: Diagnosis present

## 2015-07-20 DIAGNOSIS — E785 Hyperlipidemia, unspecified: Secondary | ICD-10-CM | POA: Diagnosis present

## 2015-07-20 DIAGNOSIS — I462 Cardiac arrest due to underlying cardiac condition: Principal | ICD-10-CM | POA: Diagnosis present

## 2015-07-20 DIAGNOSIS — G253 Myoclonus: Secondary | ICD-10-CM | POA: Diagnosis present

## 2015-07-20 DIAGNOSIS — I447 Left bundle-branch block, unspecified: Secondary | ICD-10-CM | POA: Diagnosis present

## 2015-07-20 DIAGNOSIS — Z9289 Personal history of other medical treatment: Secondary | ICD-10-CM

## 2015-07-20 DIAGNOSIS — Z6841 Body Mass Index (BMI) 40.0 and over, adult: Secondary | ICD-10-CM

## 2015-07-20 DIAGNOSIS — R569 Unspecified convulsions: Secondary | ICD-10-CM | POA: Diagnosis present

## 2015-07-20 DIAGNOSIS — Z7984 Long term (current) use of oral hypoglycemic drugs: Secondary | ICD-10-CM

## 2015-07-20 DIAGNOSIS — Z885 Allergy status to narcotic agent status: Secondary | ICD-10-CM

## 2015-07-20 DIAGNOSIS — I442 Atrioventricular block, complete: Secondary | ICD-10-CM | POA: Diagnosis not present

## 2015-07-20 DIAGNOSIS — J9600 Acute respiratory failure, unspecified whether with hypoxia or hypercapnia: Secondary | ICD-10-CM

## 2015-07-20 DIAGNOSIS — I4901 Ventricular fibrillation: Secondary | ICD-10-CM | POA: Diagnosis present

## 2015-07-20 DIAGNOSIS — Z79899 Other long term (current) drug therapy: Secondary | ICD-10-CM

## 2015-07-20 DIAGNOSIS — G9340 Encephalopathy, unspecified: Secondary | ICD-10-CM | POA: Insufficient documentation

## 2015-07-20 DIAGNOSIS — N179 Acute kidney failure, unspecified: Secondary | ICD-10-CM | POA: Diagnosis present

## 2015-07-20 DIAGNOSIS — R0902 Hypoxemia: Secondary | ICD-10-CM

## 2015-07-20 DIAGNOSIS — N400 Enlarged prostate without lower urinary tract symptoms: Secondary | ICD-10-CM | POA: Diagnosis present

## 2015-07-20 DIAGNOSIS — I110 Hypertensive heart disease with heart failure: Secondary | ICD-10-CM | POA: Diagnosis present

## 2015-07-20 DIAGNOSIS — R402212 Coma scale, best verbal response, none, at arrival to emergency department: Secondary | ICD-10-CM | POA: Diagnosis present

## 2015-07-20 DIAGNOSIS — H409 Unspecified glaucoma: Secondary | ICD-10-CM | POA: Diagnosis present

## 2015-07-20 DIAGNOSIS — J9601 Acute respiratory failure with hypoxia: Secondary | ICD-10-CM | POA: Diagnosis present

## 2015-07-20 LAB — COMPREHENSIVE METABOLIC PANEL
ALBUMIN: 2.4 g/dL — AB (ref 3.5–5.0)
ALK PHOS: 51 U/L (ref 38–126)
ALT: 53 U/L (ref 17–63)
ANION GAP: 16 — AB (ref 5–15)
AST: 68 U/L — AB (ref 15–41)
BILIRUBIN TOTAL: 0.4 mg/dL (ref 0.3–1.2)
BUN: 29 mg/dL — AB (ref 6–20)
CALCIUM: 12.3 mg/dL — AB (ref 8.9–10.3)
CO2: 21 mmol/L — AB (ref 22–32)
CREATININE: 1.69 mg/dL — AB (ref 0.61–1.24)
Chloride: 106 mmol/L (ref 101–111)
GFR calc Af Amer: 45 mL/min — ABNORMAL LOW (ref >60)
GFR, EST NON AFRICAN AMERICAN: 39 mL/min — AB (ref >60)
GLUCOSE: 289 mg/dL — AB (ref 65–99)
Potassium: 3.6 mmol/L (ref 3.5–5.1)
Sodium: 143 mmol/L (ref 135–145)
TOTAL PROTEIN: 6.2 g/dL — AB (ref 6.5–8.1)

## 2015-07-20 LAB — CBC WITH DIFFERENTIAL/PLATELET
BAND NEUTROPHILS: 0 %
BLASTS: 0 %
Basophils Absolute: 0 10*3/uL (ref 0.0–0.1)
Basophils Relative: 0 %
EOS ABS: 0 10*3/uL (ref 0.0–0.7)
Eosinophils Relative: 0 %
HEMATOCRIT: 32.8 % — AB (ref 39.0–52.0)
HEMOGLOBIN: 10 g/dL — AB (ref 13.0–17.0)
LYMPHS PCT: 43 %
Lymphs Abs: 4 10*3/uL (ref 0.7–4.0)
MCH: 30.5 pg (ref 26.0–34.0)
MCHC: 30.5 g/dL (ref 30.0–36.0)
MCV: 100 fL (ref 78.0–100.0)
MONOS PCT: 8 %
Metamyelocytes Relative: 1 %
Monocytes Absolute: 0.8 10*3/uL (ref 0.1–1.0)
Myelocytes: 1 %
NEUTROS ABS: 4.6 10*3/uL (ref 1.7–7.7)
NEUTROS PCT: 47 %
OTHER: 0 %
PROMYELOCYTES ABS: 0 %
Platelets: 173 10*3/uL (ref 150–400)
RBC: 3.28 MIL/uL — AB (ref 4.22–5.81)
RDW: 14.9 % (ref 11.5–15.5)
WBC: 9.4 10*3/uL (ref 4.0–10.5)
nRBC: 0 /100 WBC

## 2015-07-20 LAB — I-STAT ARTERIAL BLOOD GAS, ED
Bicarbonate: 28.3 mEq/L — ABNORMAL HIGH (ref 20.0–24.0)
O2 Saturation: 100 %
PCO2 ART: 61.8 mmHg — AB (ref 35.0–45.0)
Patient temperature: 98
TCO2: 30 mmol/L (ref 0–100)
pH, Arterial: 7.267 — ABNORMAL LOW (ref 7.350–7.450)
pO2, Arterial: 400 mmHg — ABNORMAL HIGH (ref 80.0–100.0)

## 2015-07-20 LAB — MAGNESIUM
MAGNESIUM: 2.4 mg/dL (ref 1.7–2.4)
Magnesium: 3.1 mg/dL — ABNORMAL HIGH (ref 1.7–2.4)

## 2015-07-20 LAB — I-STAT CHEM 8, ED
BUN: 28 mg/dL — ABNORMAL HIGH (ref 6–20)
Calcium, Ion: 1.72 mmol/L (ref 1.13–1.30)
Chloride: 102 mmol/L (ref 101–111)
Creatinine, Ser: 1.3 mg/dL — ABNORMAL HIGH (ref 0.61–1.24)
Glucose, Bld: 290 mg/dL — ABNORMAL HIGH (ref 65–99)
HEMATOCRIT: 34 % — AB (ref 39.0–52.0)
HEMOGLOBIN: 11.6 g/dL — AB (ref 13.0–17.0)
POTASSIUM: 3.5 mmol/L (ref 3.5–5.1)
SODIUM: 143 mmol/L (ref 135–145)
TCO2: 27 mmol/L (ref 0–100)

## 2015-07-20 LAB — TROPONIN I
Troponin I: 0.03 ng/mL (ref <0.031)
Troponin I: 2.12 ng/mL (ref <0.031)
Troponin I: 4.02 ng/mL (ref <0.031)

## 2015-07-20 LAB — POCT I-STAT 3, ART BLOOD GAS (G3+)
Acid-Base Excess: 2 mmol/L (ref 0.0–2.0)
Bicarbonate: 26.1 mEq/L — ABNORMAL HIGH (ref 20.0–24.0)
O2 Saturation: 100 %
PCO2 ART: 38.3 mmHg (ref 35.0–45.0)
PH ART: 7.441 (ref 7.350–7.450)
TCO2: 27 mmol/L (ref 0–100)
pO2, Arterial: 178 mmHg — ABNORMAL HIGH (ref 80.0–100.0)

## 2015-07-20 LAB — GLUCOSE, CAPILLARY
GLUCOSE-CAPILLARY: 238 mg/dL — AB (ref 65–99)
GLUCOSE-CAPILLARY: 234 mg/dL — AB (ref 65–99)
GLUCOSE-CAPILLARY: 320 mg/dL — AB (ref 65–99)
Glucose-Capillary: 236 mg/dL — ABNORMAL HIGH (ref 65–99)

## 2015-07-20 LAB — PROTIME-INR
INR: 1.12 (ref 0.00–1.49)
Prothrombin Time: 14.6 seconds (ref 11.6–15.2)

## 2015-07-20 LAB — PHOSPHORUS: Phosphorus: 5.8 mg/dL — ABNORMAL HIGH (ref 2.5–4.6)

## 2015-07-20 LAB — TRIGLYCERIDES: Triglycerides: 514 mg/dL — ABNORMAL HIGH (ref <150)

## 2015-07-20 LAB — MRSA PCR SCREENING: MRSA BY PCR: NEGATIVE

## 2015-07-20 MED ORDER — NOREPINEPHRINE BITARTRATE 1 MG/ML IV SOLN
0.0000 ug/min | Freq: Once | INTRAVENOUS | Status: AC
  Administered 2015-07-20: 5 ug/min via INTRAVENOUS
  Filled 2015-07-20: qty 4

## 2015-07-20 MED ORDER — MIDAZOLAM HCL 2 MG/2ML IJ SOLN
1.0000 mg | INTRAMUSCULAR | Status: DC | PRN
  Administered 2015-07-20 (×2): 1 mg via INTRAVENOUS
  Filled 2015-07-20: qty 2

## 2015-07-20 MED ORDER — ARTIFICIAL TEARS OP OINT: 1.0000 "application " | TOPICAL_OINTMENT | Freq: Three times a day (TID) | OPHTHALMIC | Status: DC

## 2015-07-20 MED ORDER — FENTANYL CITRATE (PF) 100 MCG/2ML IJ SOLN
50.0000 ug | INTRAMUSCULAR | Status: DC | PRN
  Administered 2015-07-20: 50 ug via INTRAVENOUS

## 2015-07-20 MED ORDER — MIDAZOLAM HCL 2 MG/2ML IJ SOLN: 1.0000 mg | Freq: Once | INTRAMUSCULAR | Status: DC

## 2015-07-20 MED ORDER — CALCIUM CHLORIDE 10 % IV SOLN
INTRAVENOUS | Status: AC | PRN
  Administered 2015-07-20 (×2): 1 g via INTRAVENOUS

## 2015-07-20 MED ORDER — SODIUM CHLORIDE 0.9 % IV SOLN
1.0000 ug/kg/min | INTRAVENOUS | Status: DC
  Filled 2015-07-20: qty 20

## 2015-07-20 MED ORDER — MIDAZOLAM BOLUS VIA INFUSION
1.0000 mg | INTRAVENOUS | Status: DC | PRN
  Filled 2015-07-20: qty 1

## 2015-07-20 MED ORDER — NOREPINEPHRINE BITARTRATE 1 MG/ML IV SOLN
0.0000 ug/min | INTRAVENOUS | Status: DC
  Administered 2015-07-20: 30 ug/min via INTRAVENOUS
  Administered 2015-07-20: 18 ug/min via INTRAVENOUS
  Administered 2015-07-20: 30 ug/min via INTRAVENOUS
  Administered 2015-07-21: 6 ug/min via INTRAVENOUS
  Administered 2015-07-21 – 2015-07-23 (×2): 3 ug/min via INTRAVENOUS
  Filled 2015-07-20 (×6): qty 4

## 2015-07-20 MED ORDER — CHLORHEXIDINE GLUCONATE 0.12% ORAL RINSE (MEDLINE KIT)
15.0000 mL | Freq: Two times a day (BID) | OROMUCOSAL | Status: DC
  Administered 2015-07-20 – 2015-07-23 (×8): 15 mL via OROMUCOSAL

## 2015-07-20 MED ORDER — ALTEPLASE 100 MG IV SOLR
INTRAVENOUS | Status: AC
  Filled 2015-07-20: qty 1

## 2015-07-20 MED ORDER — INSULIN ASPART 100 UNIT/ML ~~LOC~~ SOLN
0.0000 [IU] | SUBCUTANEOUS | Status: DC
  Administered 2015-07-20: 5 [IU] via SUBCUTANEOUS
  Administered 2015-07-20: 11 [IU] via SUBCUTANEOUS
  Administered 2015-07-21: 3 [IU] via SUBCUTANEOUS
  Administered 2015-07-21: 5 [IU] via SUBCUTANEOUS
  Administered 2015-07-21: 2 [IU] via SUBCUTANEOUS
  Administered 2015-07-21 (×3): 3 [IU] via SUBCUTANEOUS
  Administered 2015-07-22 (×6): 2 [IU] via SUBCUTANEOUS
  Administered 2015-07-22 – 2015-07-23 (×2): 3 [IU] via SUBCUTANEOUS
  Administered 2015-07-23: 2 [IU] via SUBCUTANEOUS

## 2015-07-20 MED ORDER — SODIUM BICARBONATE 8.4 % IV SOLN
INTRAVENOUS | Status: AC | PRN
  Administered 2015-07-20: 50 meq via INTRAVENOUS

## 2015-07-20 MED ORDER — ANTISEPTIC ORAL RINSE SOLUTION (CORINZ)
7.0000 mL | OROMUCOSAL | Status: DC
  Administered 2015-07-20 – 2015-07-23 (×27): 7 mL via OROMUCOSAL

## 2015-07-20 MED ORDER — SODIUM CHLORIDE 0.9 % IV SOLN
1250.0000 mg | Freq: Two times a day (BID) | INTRAVENOUS | Status: DC
  Administered 2015-07-21 – 2015-07-23 (×5): 1250 mg via INTRAVENOUS
  Filled 2015-07-20 (×6): qty 1250

## 2015-07-20 MED ORDER — FENTANYL CITRATE (PF) 100 MCG/2ML IJ SOLN: 50.0000 ug | INTRAMUSCULAR | Status: DC | PRN

## 2015-07-20 MED ORDER — CISATRACURIUM BOLUS VIA INFUSION
0.1000 mg/kg | Freq: Once | INTRAVENOUS | Status: DC
  Filled 2015-07-20: qty 16

## 2015-07-20 MED ORDER — EPINEPHRINE HCL 1 MG/ML IJ SOLN
INTRAMUSCULAR | Status: AC
  Filled 2015-07-20: qty 4

## 2015-07-20 MED ORDER — AMIODARONE HCL IN DEXTROSE 360-4.14 MG/200ML-% IV SOLN
INTRAVENOUS | Status: AC
  Administered 2015-07-20: 65 mg/h
  Filled 2015-07-20: qty 200

## 2015-07-20 MED ORDER — CALCIUM CHLORIDE 10 % IV SOLN
INTRAVENOUS | Status: AC | PRN
  Administered 2015-07-20: 1 g via INTRAVENOUS

## 2015-07-20 MED ORDER — SODIUM CHLORIDE 0.9 % IV SOLN
1.0000 mg/h | INTRAVENOUS | Status: DC
  Filled 2015-07-20: qty 10

## 2015-07-20 MED ORDER — NOREPINEPHRINE BITARTRATE 1 MG/ML IV SOLN
0.0000 ug/min | Freq: Once | INTRAVENOUS | Status: AC
  Filled 2015-07-20: qty 4

## 2015-07-20 MED ORDER — ASPIRIN 300 MG RE SUPP
300.0000 mg | RECTAL | Status: AC
  Administered 2015-07-20: 300 mg via RECTAL
  Filled 2015-07-20: qty 1

## 2015-07-20 MED ORDER — DEXTROSE 5 % IV SOLN
150.0000 mg | INTRAVENOUS | Status: AC | PRN
  Administered 2015-07-20: 150 mg via INTRAVENOUS

## 2015-07-20 MED ORDER — ATROPINE SULFATE 1 MG/ML IJ SOLN
INTRAMUSCULAR | Status: AC | PRN
  Administered 2015-07-20: 1 mg via INTRAVENOUS

## 2015-07-20 MED ORDER — MIDAZOLAM HCL 2 MG/2ML IJ SOLN
2.0000 mg | Freq: Once | INTRAMUSCULAR | Status: AC
  Administered 2015-07-20: 2 mg via INTRAVENOUS
  Filled 2015-07-20: qty 2

## 2015-07-20 MED ORDER — EPINEPHRINE HCL 0.1 MG/ML IJ SOSY
PREFILLED_SYRINGE | INTRAMUSCULAR | Status: AC | PRN
  Administered 2015-07-20 (×3): 1 mg via INTRAVENOUS
  Administered 2015-07-20: 13:00:00 via INTRAVENOUS

## 2015-07-20 MED ORDER — HEPARIN SODIUM (PORCINE) 5000 UNIT/ML IJ SOLN
5000.0000 [IU] | Freq: Three times a day (TID) | INTRAMUSCULAR | Status: DC
  Administered 2015-07-20 (×2): 5000 [IU] via SUBCUTANEOUS
  Filled 2015-07-20 (×2): qty 1

## 2015-07-20 MED ORDER — SODIUM CHLORIDE 0.9 % IV BOLUS (SEPSIS)
1000.0000 mL | Freq: Once | INTRAVENOUS | Status: AC
Start: 2015-07-20 — End: 2015-07-20
  Administered 2015-07-20: 1000 mL via INTRAVENOUS

## 2015-07-20 MED ORDER — MIDAZOLAM HCL 2 MG/2ML IJ SOLN
INTRAMUSCULAR | Status: AC
  Administered 2015-07-20: 1 mg via INTRAVENOUS
  Filled 2015-07-20: qty 2

## 2015-07-20 MED ORDER — FENTANYL CITRATE (PF) 100 MCG/2ML IJ SOLN
INTRAMUSCULAR | Status: AC
  Administered 2015-07-20: 50 ug via INTRAVENOUS
  Filled 2015-07-20: qty 2

## 2015-07-20 MED ORDER — MAGNESIUM SULFATE 50 % IJ SOLN
INTRAMUSCULAR | Status: AC | PRN
  Administered 2015-07-20: 2 g via INTRAVENOUS

## 2015-07-20 MED ORDER — SODIUM CHLORIDE 0.9 % IV SOLN
25.0000 ug/h | INTRAVENOUS | Status: DC
  Filled 2015-07-20: qty 50

## 2015-07-20 MED ORDER — CISATRACURIUM BOLUS VIA INFUSION
0.0500 mg/kg | INTRAVENOUS | Status: DC | PRN
  Filled 2015-07-20: qty 8

## 2015-07-20 MED ORDER — TENECTEPLASE 50 MG IV KIT
PACK | INTRAVENOUS | Status: AC
  Filled 2015-07-20: qty 10

## 2015-07-20 MED ORDER — VANCOMYCIN HCL 10 G IV SOLR
2500.0000 mg | Freq: Once | INTRAVENOUS | Status: AC
  Administered 2015-07-20: 2500 mg via INTRAVENOUS
  Filled 2015-07-20: qty 2500

## 2015-07-20 MED ORDER — DEXTROSE 50 % IV SOLN
INTRAVENOUS | Status: AC | PRN
  Administered 2015-07-20: 1 via INTRAVENOUS

## 2015-07-20 MED ORDER — CALCIUM CHLORIDE 10 % IV SOLN
INTRAVENOUS | Status: AC
  Filled 2015-07-20: qty 10

## 2015-07-20 MED ORDER — ASPIRIN 300 MG RE SUPP
150.0000 mg | Freq: Every day | RECTAL | Status: DC
  Administered 2015-07-21 – 2015-07-23 (×3): 150 mg via RECTAL
  Filled 2015-07-20 (×3): qty 1

## 2015-07-20 MED ORDER — SODIUM BICARBONATE 8.4 % IV SOLN
INTRAVENOUS | Status: AC | PRN
  Administered 2015-07-20 (×2): 50 meq via INTRAVENOUS

## 2015-07-20 MED ORDER — PIPERACILLIN-TAZOBACTAM 3.375 G IVPB
3.3750 g | Freq: Three times a day (TID) | INTRAVENOUS | Status: DC
  Administered 2015-07-20 – 2015-07-23 (×8): 3.375 g via INTRAVENOUS
  Filled 2015-07-20 (×10): qty 50

## 2015-07-20 MED ORDER — PROPOFOL 1000 MG/100ML IV EMUL
0.0000 ug/kg/min | INTRAVENOUS | Status: DC
  Administered 2015-07-20 (×2): 40 ug/kg/min via INTRAVENOUS
  Administered 2015-07-20: 20 ug/kg/min via INTRAVENOUS
  Administered 2015-07-21 (×3): 40 ug/kg/min via INTRAVENOUS
  Administered 2015-07-21 – 2015-07-22 (×6): 20 ug/kg/min via INTRAVENOUS
  Filled 2015-07-20 (×5): qty 100
  Filled 2015-07-20: qty 200
  Filled 2015-07-20 (×3): qty 100
  Filled 2015-07-20: qty 200
  Filled 2015-07-20 (×5): qty 100

## 2015-07-20 MED ORDER — FENTANYL CITRATE (PF) 100 MCG/2ML IJ SOLN
50.0000 ug | Freq: Once | INTRAMUSCULAR | Status: DC
Start: 2015-07-20 — End: 2015-07-20

## 2015-07-20 MED ORDER — PANTOPRAZOLE SODIUM 40 MG IV SOLR
40.0000 mg | Freq: Every day | INTRAVENOUS | Status: DC
  Administered 2015-07-20 – 2015-07-22 (×3): 40 mg via INTRAVENOUS
  Filled 2015-07-20 (×3): qty 40

## 2015-07-20 MED ORDER — SODIUM CHLORIDE 0.9 % IV SOLN: 2000.0000 mL | Freq: Once | INTRAVENOUS | Status: DC

## 2015-07-20 MED ORDER — FENTANYL BOLUS VIA INFUSION
25.0000 ug | INTRAVENOUS | Status: DC | PRN
  Filled 2015-07-20: qty 25

## 2015-07-20 MED ORDER — MIDAZOLAM HCL 2 MG/2ML IJ SOLN: 1.0000 mg | INTRAMUSCULAR | Status: DC | PRN

## 2015-07-20 MED ORDER — BISACODYL 10 MG RE SUPP: 10.0000 mg | Freq: Every day | RECTAL | Status: DC | PRN

## 2015-07-20 NOTE — Progress Notes (Signed)
Bedside EEG completed, results pending. 

## 2015-07-20 NOTE — ED Notes (Signed)
Contact information:  Boykin NearingShelia Gallardo (wife): [phone] Molly MaduroRobert (grandson): [phone]

## 2015-07-20 NOTE — Procedures (Signed)
History: 70 yo M s/p cardiac arrest  Sedation: Propofol 7120mcg/kg/min  Technique: This is a 21 channel routine scalp EEG performed at the bedside with bipolar and monopolar montages arranged in accordance to the international 10/20 system of electrode placement. One channel was dedicated to EKG recording.    Background: The background consists of a burst suppression pattern with the bursts consisting of epileptiform activity associated with generalized myoclonus. There is complete suppression between the bursts. There is an interburst interval of 5 - 15 seconds and burst duration of 1 - 3 seconds.   Photic stimulation: Physiologic driving is not performed.   EEG Abnormalities: 1) Burst Suppression with epileptiform appearing bursts   Clinical Interpretation: This EEG is profoundly abnormal. This appearance coupled with the  myoclonus associated with the bursts is highly correlated with dismal prognosis following cardiac arrest.   Ritta SlotMcNeill Sarvesh Meddaugh, MD [facility] [phone]  If 7pm- 7am, please page neurology on call as listed in AMION.

## 2015-07-20 NOTE — ED Notes (Addendum)
[facility], rhythm changed to wider complex QRS. Hr45. BP 65/31 (43), increased levophed to 25 mcg and EDP called to bedside. One amp NaHCL and one amp Cal Chloride and One amp atropine given per verbal orders. Pt on [facility] monitor at present time. Rectal temp 98.5.

## 2015-07-20 NOTE — ED Notes (Addendum)
Pt stable at present time. Blood pressure under way on their monitor.

## 2015-07-20 NOTE — ED Notes (Signed)
Pt's wife reports they were getting into their vehicle to go to the farmer[place] market when pt started gasping for air and lost consciousness. Pt's wife drove pt to hospital immediately (several minutes to arrival).

## 2015-07-20 NOTE — ED Notes (Signed)
Cooling measures initiated with ice packs per MD order.

## 2015-07-20 NOTE — ED Notes (Signed)
[facility]

## 2015-07-20 NOTE — Progress Notes (Signed)
Deep ETT suctioning performed. Pt's eyes open, gazing upward but no gag response present x2.

## 2015-07-20 NOTE — Consult Note (Addendum)
Admit date: 07/06/2015 Referring Physician  Dr. Isaiah SergeMannam Primary Cardiologist  None Reason for Consultation  Cardiac arrest  HPI: This is a 70 Y/O with PMH of hyperlipidemia, HTN, OSA, BPH, glaucoma, CHF and DM who lost consciousness as they were getting into the [place] to go to the farmers [place]. Wife drove the pt to ED which took about 8 or more mins and CPR initiated there. Noted to be in PEA arrest with one episode of V fib on arrival. Resuscitated in ED with about 10 mins to ROSC. He then lost his pulse again and then coded for 5 more minutes. Intubated, CVC placed and on pressors. [facility].   He has chronic medical problems, [facility] He is mostly immobile at home. He has purulent drainage from the back wounds over the past few days. He has no history of CAD but a history of chronic diastolic CHF and [facility] that was low risk for ischemia.  Patient now intubated and visibly having seizures.  EEG performed at bedside markedly abnormal and highly correlated with dismal prognosis.  Cardiology asked to consult to follow for assistance with cardiac arrhythmias.       PMH:   Past Medical History  Diagnosis Date  . Hyperlipidemia   . Hypertension   . Sleep apnea, obstructive     on CPAP  . BPH (benign prostatic hyperplasia)   . Glaucoma   . Cataract   . Arthritis   . Anxiety   . Depression 03/24/2003    maintained on Symbyax.  Hospitalization in 2002 for one week.   . CHF (congestive heart failure) (HCC)     echo 06/12/13 normal EF; diastolic function unable to be assesed.  Cardiolite 06/12/13 low risk for ischemia. [facility].  . Diabetes mellitus without complication (HCC)      PSH:   Past Surgical History  Procedure Laterality Date  . Right heel       (congenital birth defect)  . Spine surgery  03/23/2000    03/23/2001.  Lumbar and cervical spine.  . Foot  surgery  03/23/1993    R subtalar joint  . Behavioral health admission  03/23/2000    admission x 7 days.  [facility].    Allergies:  Codeine Prior to Admit Meds:   Prescriptions prior to admission  Medication Sig Dispense Refill Last Dose  . aspirin 81 MG tablet Take 81 mg by mouth daily.   Taking  . atorvastatin (LIPITOR) 20 MG tablet TAKE 1 TABLET BY MOUTH ONCE DAILY   Taking  . buPROPion (WELLBUTRIN XL) 150 MG 24 hr tablet TAKE ONE TABLET (150 MG TOTAL) BY MOUTH DAILY.  2 Taking  . COMBIGAN 0.2-0.5 % ophthalmic solution APPLY 1 DROP INTO BOTH EYES TWICE A DAY  6 Taking  . furosemide (LASIX) 40 MG tablet TAKE 1 TABLET BY MOUTH TWICE A DAY   Taking  . latanoprost (XALATAN) 0.005 % ophthalmic solution 1 drop.   Taking  . lisinopril (PRINIVIL,ZESTRIL) 2.5 MG tablet TAKE 1 TABLET (2.5 MG TOTAL) BY MOUTH DAILY.  0 Taking  . metFORMIN (GLUCOPHAGE-XR) 500 MG 24 hr tablet TAKE 1 TABLET BY MOUTH TWICE A DAY   Taking  . olanzapine-FLUoxetine (SYMBYAX) 6-50 MG per capsule Take 1 capsule by mouth every evening.   Taking  . FLUZONE HIGH-DOSE 0.5 ML SUSY TO BE ADMINISTERED BY PHARMACIST FOR  IMMUNIZATION  0 Taking  . FREESTYLE LITE test strip    Taking   Fam HX:    Family History  Problem Relation Age of Onset  . Adopted: Yes   Social HX:    Social History   Social History  . Marital Status: Married    Spouse Name: N/A  . Number of Children: N/A  . Years of Education: N/A   Occupational History  . Not on file.   Social History Main Topics  . Smoking status: Former Games developer  . Smokeless tobacco: Former Neurosurgeon    Types: Chew    Quit date: 10/03/1972  . Alcohol Use: No  . Drug Use: No  . Sexual Activity: Not on file   Other Topics Concern  . 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       Tobacco:  No tobacco.      Alcohol: none now      Drugs: none      Exercise: none     ROS:  All 11 ROS were addressed and are negative except what is stated in the HPI  Physical Exam: Blood pressure 108/56, pulse 51, resp. rate 18, height 6' (1.829 m), weight 339 lb 8.1 oz (154 kg), SpO2 100 %.    General: Well developed, well nourished WM intubated and sedated Head: seizure activity noted with eye blinking and facial twitching  Lungs:   Clear bilaterally to auscultation anteriorly Heart:   HRRR S1 S2 Pulses are 2+ & equal.            No carotid bruit. No JVD.  No abdominal bruits. No femoral bruits. Abdomen: Bowel sounds are positive, abdomen soft and non-tender without masses Extremities:   No clubbing, cyanosis or edema.  DP +1 Neuro: intubated and unresponsive     Labs:   Lab Results  Component Value Date   WBC 9.4 Aug 11, 2015   HGB 11.6* 08-11-2015   HCT 34.0* 08-11-15   MCV 100.0 Aug 11, 2015   PLT 173 2015-08-11    Recent Labs Lab 08/11/15 1235 2015/08/11 1241  NA 143 143  K 3.6 3.5  CL 106 102  CO2 21*  --   BUN 29* 28*  CREATININE 1.69* 1.30*  CALCIUM 12.3*  --   PROT 6.2*  --   BILITOT 0.4  --   ALKPHOS 51  --   ALT 53  --   AST 68*  --   GLUCOSE 289* 290*   No results found for: PTT Lab Results  Component Value Date   INR 1.12 08/11/15   INR 0.93 01/14/2015   INR 1.1 06/26/2008   Lab Results  Component Value Date   TROPONINI 0.03 08-11-15     Lab Results  Component Value Date   CHOL  06/26/2008    123        ATP III CLASSIFICATION:  <200     mg/dL   Desirable  161-096  mg/dL   Borderline High  >=045    mg/dL   High          Lab Results  Component Value Date   HDL 35* 06/26/2008   Lab Results  Component Value Date   [facility]  06/26/2008  81        Total Cholesterol/HDL:CHD Risk Coronary Heart Disease Risk Table                     Men   Women  1/2 Average Risk   3.4   3.3  Average Risk       5.0   4.4  2 X Average Risk    9.6   7.1  3 X Average Risk  23.4   11.0        Use the calculated Patient Ratio above and the CHD Risk Table to determine the patient's CHD Risk.        ATP III CLASSIFICATION (LDL):  <100     mg/dL   Optimal  161-096  mg/dL   Near or Above                    Optimal  130-159  mg/dL   Borderline  045-409  mg/dL   High  >811     mg/dL   Very High   Lab Results  Component Value Date   TRIG 35 06/26/2008   Lab Results  Component Value Date   CHOLHDL 3.5 06/26/2008   No results found for: LDLDIRECT    Radiology:  Dg Chest Portable 1 View  Aug 12, 2015  CLINICAL DATA:  CPR EXAM: PORTABLE CHEST 1 VIEW COMPARISON:  05/29/2015 FINDINGS: Endotracheal tube placed. Tip is 3.9 cm from the carina. Low lung volumes. Cardiomegaly. Lungs are grossly clear. IMPRESSION: Endotracheal tube is 3.9 cm from the carina Cardiomegaly and low lung volumes. Electronically Signed   By: Jolaine Click M.D.   On: 08-12-2015 13:15    EKG:  LBBB with 2nd degree type 2 AV block vs. Complete heart block with ventricular escape rhythm with HR 50's  ASSESSMENT/PLAN:   1.  Cardiac Arrest with PEA arrest given one round of epi and then apparently Vfib arrest.  Down time to ROSC was at least 8 minutes without CPR and then 10 minutes in ER to ROSC.  Patient noted to be in at least 2:1 second degree type II AVB with HR 50's.  EEG markedly abnormal and consistent with anoxic brain injury with grim prognosis.  Continue Levophed for pressor support.  Change to IV Heparin gtt if ok with CCM.  ASA  PR.   2.  Second degree type 2 heart block with HR 50's ? Whether he had higher degree AV block leading to arrest vs. Coronary ischemia resulting is bradyarrhythmias and vfib.  At this time there is no indication for temporary PPM or emergent cath.  His HR is in the 50's and he has evidence of severe anoxic brain injury on EEG with evidence of seizure activity on exam.  Chance of recovery is very grim.  3.  Elevated troponin  secondary to #1 and CPR with shock.  Will continue to cycle enzymes.  This could be acute coronary syndrome but no ST elevation on initial EKG with RBBB.  He now has a LBBB in setting of heart block and could represent ischemic rhythm but at this time no indication to take to cath lab with evidence of active seizing and EEG with severe anoxic brain damage. CCM to address goals of care with family. No BB or amio for Vfib arrest due to underlying heart block.   3.  Acute Respiratory failure secondary to #1.  Per CCM.    Quintella Reichert, MD  2015-08-12  4:31 PM

## 2015-07-20 NOTE — Code Documentation (Signed)
Family updated as to patient's status.

## 2015-07-20 NOTE — Progress Notes (Signed)
Pharmacy Antibiotic Note  Stephen PortelaRobert [name] is a 70 y.o. male admitted on 06/27/2015 with sepsis.  Pharmacy has been consulted for vancomycin and zosyn dosing.  Pt received loading dose of vancomycin 2500mg  IV once.  Plan: Vancomycin 1250mg  IV every 12 hours.  Goal trough 15-20 mcg/mL. Zosyn 3.375g IV q8h (4 hour infusion).  Monitor culture data, renal function and clinical course [facility]  Height: 6' (182.9 cm) Weight: (!) 339 lb 8.1 oz (154 kg) IBW/kg (Calculated) : 77.6  No data recorded.   Recent Labs Lab 06/29/2015 1235 07/07/2015 1241  WBC 9.4  --   CREATININE 1.69* 1.30*    Estimated Creatinine Clearance: 78.6 mL/min (by C-G formula based on Cr of 1.3).    Allergies  Allergen Reactions  . Codeine Other (See Comments)    hallucinations    Antimicrobials this admission: Vanc 4/29 >>  Zosyn 4/29 >>   Dose adjustments this admission: n/a  Microbiology results:  BCx:   UCx:    Sputum:   4/29 MRSA PCR: sent  Arlean Hoppingorey M. Newman PiesBall, PharmD, BCPS Clinical Pharmacist Pager [phone]  07/15/2015 4:02 PM

## 2015-07-20 NOTE — H&P (Addendum)
PULMONARY / CRITICAL CARE MEDICINE   Name: [name] MRN: [id number] DOB: [birth year]    ADMISSION DATE:  07/19/2015 CONSULTATION DATE:  07/15/2015  REFERRING MD: [facility].  CHIEF COMPLAINT:   70 Y/O with PMH of hyperlipidemia, HTN OSA, BPH, glaucoma, CHF, DM. Lost consciousness as they were getting into the [place] to go [place] the [place]. Wife drove the pt to ED which took about 8 or more mins and CPR initiated there. Noted to be in PEA arrest with one episode of V fib. Resuscitated in ED with about 10 mins to ROSC. Then he lost his pulse again and then coded for 5 more minutes. Intubated, CVC placed and on pressors. Transferred to [facility] for further eval.   He has chronic medical problems, recent back surgery at [place] home from rehab on 07/13/16. He is mostly immobile at home. He has purulent drainage from the back wounds over the past few days.   HISTORY OF PRESENT ILLNESS:    PAST MEDICAL HISTORY :  He  has a past medical history of Hyperlipidemia; Hypertension; Sleep apnea, obstructive; BPH (benign prostatic hyperplasia); Glaucoma; Cataract; Arthritis; Anxiety; Depression (03/24/2003); CHF (congestive heart failure) ([place]); and Diabetes mellitus without complication ([place]).  PAST SURGICAL HISTORY: He  has past surgical history that includes right heel ; Spine surgery (03/23/2000); Foot surgery (03/23/1993); and Behavioral Health admission (03/23/2000).  Allergies  Allergen Reactions  . Codeine Other (See Comments)    hallucinations    No current facility-administered medications on file prior to encounter.   Current Outpatient Prescriptions on File Prior to Encounter  Medication Sig  . aspirin 81 MG tablet Take 81 mg by mouth daily.  Marland Kitchen atorvastatin (LIPITOR) 20 MG tablet TAKE 1 TABLET BY MOUTH ONCE DAILY  . buPROPion (WELLBUTRIN XL) 150 MG 24 hr tablet TAKE ONE TABLET (150 MG TOTAL) BY MOUTH DAILY.  Marland Kitchen COMBIGAN 0.2-0.5 % ophthalmic solution APPLY 1 DROP INTO BOTH  EYES TWICE A DAY  . furosemide (LASIX) 40 MG tablet TAKE 1 TABLET BY MOUTH TWICE A DAY  . latanoprost (XALATAN) 0.005 % ophthalmic solution 1 drop.  Marland Kitchen lisinopril (PRINIVIL,ZESTRIL) 2.5 MG tablet TAKE 1 TABLET (2.5 MG TOTAL) BY MOUTH DAILY.  . metFORMIN (GLUCOPHAGE-XR) 500 MG 24 hr tablet TAKE 1 TABLET BY MOUTH TWICE A DAY  . olanzapine-FLUoxetine (SYMBYAX) 6-50 MG per capsule Take 1 capsule by mouth every evening.  Marland Kitchen FLUZONE HIGH-DOSE 0.5 ML SUSY TO BE ADMINISTERED BY PHARMACIST FOR IMMUNIZATION  . FREESTYLE LITE test strip     FAMILY HISTORY:  His is adopted.  SOCIAL HISTORY: He  reports that he has quit smoking. He quit smokeless tobacco use about 42 years ago. His smokeless tobacco use included Chew. He reports that he does not drink alcohol or use illicit drugs.  REVIEW OF SYSTEMS:   Unable to obtain as pt is unresponsive.  SUBJECTIVE:   VITAL SIGNS: BP 95/52 mmHg  Pulse 51  Resp 22  Ht 6' (1.829 m)  Wt 339 lb 8.1 oz (154 kg)  BMI 46.04 kg/m2  SpO2 100%  HEMODYNAMICS:    VENTILATOR SETTINGS: Vent Mode:  [-] PRVC FiO2 (%):  [60 %-100 %] 60 % Set Rate:  [22 bmp] 22 bmp PEEP:  [5 cmH20] 5 cmH20 Plateau Pressure:  [20 cmH20-29 cmH20] 29 cmH20  INTAKE / OUTPUT:    PHYSICAL EXAMINATION: General:  No distress Neuro:  Myoclonic jerks.  HEENT:  PERRL, No JVD Cardiovascular:  Bradycardia Lungs:  RRR, NO MRG Abdomen:  Obese, distended. Musculoskeletal:   Skin:  Intact.  LABS:  BMET  Recent Labs Lab 06/26/2015 1235 07/11/2015 1241  NA 143 143  K 3.6 3.5  CL 106 102  CO2 21*  --   BUN 29* 28*  CREATININE 1.69* 1.30*  GLUCOSE 289* 290*    Electrolytes  Recent Labs Lab 06/23/2015 1235  CALCIUM 12.3*  MG 3.1*    CBC  Recent Labs Lab 07/13/2015 1235 07/06/2015 1241  WBC 9.4  --   HGB 10.0* 11.6*  HCT 32.8* 34.0*  PLT 173  --     Coag's  Recent Labs Lab 07/19/2015 1235  INR 1.12    Sepsis Markers No results for input(s): LATICACIDVEN,  PROCALCITON, O2SATVEN in the last 168 hours.  ABG  Recent Labs Lab 07/09/2015 1237  PHART 7.267*  PCO2ART 61.8*  PO2ART 400.0*    Liver Enzymes  Recent Labs Lab 07/13/2015 1235  AST 68*  ALT 53  ALKPHOS 51  BILITOT 0.4  ALBUMIN 2.4*    Cardiac Enzymes  Recent Labs Lab 07/05/2015 1235  TROPONINI 0.03    Glucose No results for input(s): GLUCAP in the last 168 hours.  Imaging Dg Chest Portable 1 View  07/15/2015  CLINICAL DATA:  CPR EXAM: PORTABLE CHEST 1 VIEW COMPARISON:  05/29/2015 FINDINGS: Endotracheal tube placed. Tip is 3.9 cm from the carina. Low lung volumes. Cardiomegaly. Lungs are grossly clear. IMPRESSION: Endotracheal tube is 3.9 cm from the carina Cardiomegaly and low lung volumes. Electronically Signed   By: Marybelle Killings M.D.   On: 06/27/2015 13:15   STUDIES:   CULTURES: Bcx Ucx Sp Cx  ANTIBIOTICS: Vanco Zosyn  SIGNIFICANT EVENTS:  LINES/TUBES:  DISCUSSION: 70 Y/O with multiple medical co morbidities. Admitted after arrest at home. Found to be in PEA/Vfib. Rhythm on EKG concerning for complete heart block, LBBB concerning for acute coronary syndrome.  He has myoclonus on examination with signs of anoxic brain injury. He went about 8 min without CPR.   ASSESSMENT / PLAN:  PULMONARY A: VDRF Recent surgery. Concern for PE P:   Continue vent support Repeat CXR, ABG LE dopplers  CARDIOVASCULAR A:  High grade AV block LBBB Concern for ACS P:  Cardiology consult Repeat EKG. Follow troponins.  RENAL A:   AKI post arrest P:   Follow urine output and Cr  GASTROINTESTINAL A:   Stable P:   Keep NPO  HEMATOLOGIC A:   Stable P:  Monitor CBC  INFECTIOUS A:   Recent back surgery with purulent discharge P:   Cover with vanco, zosyn Check cultures, procalcitonin  ENDOCRINE A:   DM P:   SSI  NEUROLOGIC A:   Anoxic injury Myoclonic jerks Not a hypothermia candidate due to prolonged time to ROSC P:   Sedation CT  head EEG  FAMILY  - Updates: I met with the wife and family and reviewed the events and expressed my concern that he may have suffered severe anoxic injury. He has a living will that he would not want life sustaining treatments in events of coma, vegetative state. We will meet again after we get more information from EEG and head CT. If it is obvious that he would not recover neuro function they would be OK with withdrawal. He has been made DNR in event of another cardiac arrest.  - Inter-disciplinary family meet or Palliative Care meeting due by:  day 7  Critical care time - 45 mins.  Marshell Garfinkel MD [place]  Pulmonary and Critical Care Pager [phone] If no answer or after 3pm call: [phone] 06/23/2015, 3:16 PM

## 2015-07-20 NOTE — ED Provider Notes (Addendum)
CSN: [id number]     Arrival date & time 06/30/2015  1157 History   First MD Initiated Contact with Patient 07/06/2015 1249     Chief Complaint  Patient presents with  . Cardiac Arrest     (Consider location/radiation/quality/duration/timing/severity/associated sxs/prior Treatment) Patient is a 70 y.o. male presenting with syncope and general illness. The history is provided by the spouse.  Loss of Consciousness Episode history:  Single Most recent episode:  Today Timing:  Constant Chronicity:  New Witnessed: yes   Relieved by:  None tried Worsened by:  Nothing tried Ineffective treatments:  None tried Illness Severity:  Severe Onset quality:  Sudden Timing:  Constant   Past Medical History  Diagnosis Date  . Hyperlipidemia   . Hypertension   . Sleep apnea, obstructive     on CPAP  . BPH (benign prostatic hyperplasia)   . Glaucoma   . Cataract   . Arthritis   . Anxiety   . Depression 03/24/2003    maintained on Symbyax.  Hospitalization in 2002 for one week.   . CHF (congestive heart failure) (HCC)     echo 06/12/13 normal EF; diastolic function unable to be assesed.  Cardiolite 06/12/13 low risk for ischemia. [facility].  . Diabetes mellitus without complication [facility])    Past Surgical History  Procedure Laterality Date  . Right heel       (congenital birth defect)  . Spine surgery  03/23/2000    03/23/2001.  Lumbar and cervical spine.  . Foot surgery  03/23/1993    R subtalar joint  . Behavioral health admission  03/23/2000    admission x 7 days.  [facility].   Family History  Problem Relation Age of Onset  . Adopted: Yes   Social History  Substance Use Topics  . Smoking status: Former Games developer  . Smokeless tobacco: Former Neurosurgeon    Types: Chew    Quit date: 10/03/1972  . Alcohol Use: No    Review of Systems  Unable to perform ROS: Patient unresponsive  Cardiovascular: Positive for syncope.      Allergies  Codeine  Home Medications    Prior to Admission medications   Medication Sig Start Date End Date Taking? Authorizing Provider  aspirin 81 MG tablet Take 81 mg by mouth daily.   Yes Historical Provider, MD  atorvastatin (LIPITOR) 20 MG tablet TAKE 1 TABLET BY MOUTH ONCE DAILY 02/18/15  Yes Historical Provider, MD  buPROPion (WELLBUTRIN XL) 150 MG 24 hr tablet TAKE ONE TABLET (150 MG TOTAL) BY MOUTH DAILY. 02/18/15  Yes Historical Provider, MD  COMBIGAN 0.2-0.5 % ophthalmic solution APPLY 1 DROP INTO BOTH EYES TWICE A DAY 01/13/15  Yes Historical Provider, MD  furosemide (LASIX) 40 MG tablet TAKE 1 TABLET BY MOUTH TWICE A DAY 02/18/15  Yes Historical Provider, MD  latanoprost (XALATAN) 0.005 % ophthalmic solution 1 drop. 05/23/14  Yes Historical Provider, MD  lisinopril (PRINIVIL,ZESTRIL) 2.5 MG tablet TAKE 1 TABLET (2.5 MG TOTAL) BY MOUTH DAILY. 02/16/15  Yes Historical Provider, MD  metFORMIN (GLUCOPHAGE-XR) 500 MG 24 hr tablet TAKE 1 TABLET BY MOUTH TWICE A DAY 02/18/15  Yes Historical Provider, MD  olanzapine-FLUoxetine (SYMBYAX) 6-50 MG per capsule Take 1 capsule by mouth every evening.   Yes Historical Provider, MD  FLUZONE HIGH-DOSE 0.5 ML SUSY TO BE ADMINISTERED BY PHARMACIST FOR IMMUNIZATION 02/07/15   Historical Provider, MD  FREESTYLE LITE test strip  01/13/15   Historical Provider, MD   BP 107/67 mmHg  Pulse 49  Resp 22  Ht 6' (1.829 m)  Wt 339 lb 8.1 oz (154 kg)  BMI 46.04 kg/m2  SpO2 100% Physical Exam  Constitutional: He appears well-developed and well-nourished. He appears distressed.  HENT:  Head: Normocephalic.  Abrasions to face  Eyes: Conjunctivae are normal. Pupils are equal, round, and reactive to light.  Neck: Normal range of motion. Neck supple.  Cardiovascular:  pulseless  Pulmonary/Chest: He has no wheezes.  Apneic   Abdominal: Soft. He exhibits no distension.  Musculoskeletal: He exhibits no edema.  Neurological: GCS eye subscore is 1. GCS verbal subscore is 1. GCS motor subscore is  1.  Skin: Skin is dry.  Pale, mottled, cold    ED Course  .Intubation Date/Time: 2015-07-22 3:29 PM Performed by: Marily Memos Authorized by: Marily Memos Consent: The procedure was performed in an emergent situation. Indications: respiratory failure and  airway protection Intubation method: video-assisted Patient status: unconscious Preoxygenation: BVM Laryngoscope size: Mac 4 Tube size: 7.5 mm Tube type: cuffed Number of attempts: 1 Cords visualized: yes Post-procedure assessment: chest rise Breath sounds: equal ETT to teeth: 22 cm Tube secured with: ETT holder Chest x-ray interpreted by me. Chest x-ray findings: endotracheal tube in appropriate position Patient tolerance: Patient tolerated the procedure well with no immediate complications  .Central Line Date/Time: Jul 22, 2015 3:31 PM Performed by: Marily Memos Authorized by: Marily Memos Consent: The procedure was performed in an emergent situation. Indications: vascular access Patient sedated: no Preparation: skin prepped with 2% chlorhexidine Skin prep agent dried: skin prep agent completely dried prior to procedure Sterile barriers: all five maximum sterile barriers used - cap, mask, sterile gown, sterile gloves, and large sterile sheet Hand hygiene: hand hygiene performed prior to central venous catheter insertion Location details: left femoral Site selection rationale: CPR in progress, no access to upper sites Patient position: flat Catheter type: triple lumen Ultrasound guidance: yes Sterile ultrasound techniques: sterile gel and sterile probe covers were used Number of attempts: 1 Successful placement: yes Post-procedure: line sutured and dressing applied Assessment: blood return through all ports and free fluid flow Patient tolerance: Patient tolerated the procedure well with no immediate complications  IO LINE INSERTION Date/Time: 2015/07/22 3:32 PM Performed by: Marily Memos Authorized by: Marily Memos Consent: The procedure was performed in an emergent situation. Indications: rapid vascular access Local anesthesia used: no Patient sedated: no Insertion site: left proximal tibia Site preparation: alcohol Preparation: Patient was prepped and draped in the usual sterile fashion. Insertion device: drill device Insertion: needle was inserted through the bony cortex Number of attempts: 1 Confirmation method: stability of the needle, easy infusion of fluids and aspiration of blood/marrow Secured with: tape and gauze dressing Patient tolerance: Patient tolerated the procedure well with no immediate complications .Cardioversion Date/Time: 2015/07/22 3:33 PM Performed by: Marily Memos Authorized by: Marily Memos Consent: The procedure was performed in an emergent situation. Patient sedated: no Cardioversion basis: emergent Pre-procedure rhythm: ventricular tachycardia Patient position: patient was placed in a supine position Chest area: chest area exposed Electrodes: pads Electrodes placed: anterior-posterior Number of attempts: 1 Attempt 1 mode: synchronous Attempt 1 shock (in Joules): 150 Attempt 1 outcome: conversion to normal sinus rhythm Post-procedure rhythm: normal sinus rhythm Complications: no complications Patient tolerance: Patient tolerated the procedure well with no immediate complications   CRITICAL CARE Performed by: Marily Memos   Total critical care time: 35 minutes Critical care time was exclusive of separately billable procedures and treating other patients. Critical care was necessary to treat  or prevent imminent or life-threatening deterioration. Critical care was time spent personally by me on the following activities: development of treatment plan with patient and/or surrogate as well as nursing, discussions with consultants, evaluation of patient's response to treatment, examination of patient, obtaining history from patient or surrogate, ordering  and performing treatments and interventions, ordering and review of laboratory studies, ordering and review of radiographic studies, pulse oximetry and re-evaluation of patient's condition.   Cardiopulmonary Resuscitation (CPR) Procedure Note Directed/Performed by: Marily Memos I personally directed ancillary staff and/or performed CPR in an effort to regain return of spontaneous circulation and to maintain cardiac, neuro and systemic perfusion.     Labs Review Labs Reviewed  CBC WITH DIFFERENTIAL/PLATELET - Abnormal; Notable for the following:    RBC 3.28 (*)    Hemoglobin 10.0 (*)    HCT 32.8 (*)    All other components within normal limits  COMPREHENSIVE METABOLIC PANEL - Abnormal; Notable for the following:    CO2 21 (*)    Glucose, Bld 289 (*)    BUN 29 (*)    Creatinine, Ser 1.69 (*)    Calcium 12.3 (*)    Total Protein 6.2 (*)    Albumin 2.4 (*)    AST 68 (*)    GFR calc non Af Amer 39 (*)    GFR calc Af Amer 45 (*)    Anion gap 16 (*)    All other components within normal limits  MAGNESIUM - Abnormal; Notable for the following:    Magnesium 3.1 (*)    All other components within normal limits  GLUCOSE, CAPILLARY - Abnormal; Notable for the following:    Glucose-Capillary 238 (*)    All other components within normal limits  I-STAT ARTERIAL BLOOD GAS, ED - Abnormal; Notable for the following:    pH, Arterial 7.267 (*)    pCO2 arterial 61.8 (*)    pO2, Arterial 400.0 (*)    Bicarbonate 28.3 (*)    All other components within normal limits  I-STAT CHEM 8, ED - Abnormal; Notable for the following:    BUN 28 (*)    Creatinine, Ser 1.30 (*)    Glucose, Bld 290 (*)    Calcium, Ion 1.72 (*)    Hemoglobin 11.6 (*)    HCT 34.0 (*)    All other components within normal limits  MRSA PCR SCREENING  PROTIME-INR  TROPONIN I  LACTIC ACID, PLASMA  LACTIC ACID, PLASMA  BLOOD GAS, ARTERIAL  TROPONIN I  TROPONIN I  TROPONIN I  MAGNESIUM  PHOSPHORUS  TROPONIN I   TROPONIN I  TROPONIN I  BASIC METABOLIC PANEL  BASIC METABOLIC PANEL  BASIC METABOLIC PANEL  BASIC METABOLIC PANEL  BASIC METABOLIC PANEL  BASIC METABOLIC PANEL  BASIC METABOLIC PANEL  PROTIME-INR  PROTIME-INR  APTT  APTT  BLOOD GAS, ARTERIAL  BLOOD GAS, ARTERIAL    Imaging Review Dg Chest Portable 1 View  06/28/2015  CLINICAL DATA:  CPR EXAM: PORTABLE CHEST 1 VIEW COMPARISON:  05/29/2015 FINDINGS: Endotracheal tube placed. Tip is 3.9 cm from the carina. Low lung volumes. Cardiomegaly. Lungs are grossly clear. IMPRESSION: Endotracheal tube is 3.9 cm from the carina Cardiomegaly and low lung volumes. Electronically Signed   By: Jolaine Click M.D.   On: 07/18/2015 13:15   I have personally reviewed and evaluated these images and lab results as part of my medical decision-making.   EKG Interpretation   Date/Time:  Saturday July 20 2015 12:17:34 EDT Ventricular Rate:  67 PR Interval:    QRS Duration: 171 QT Interval:  542 QTC Calculation: 572 R Axis:   131 Text Interpretation:  Complete AV block with wide QRS complex RBBB and  LPFB Confirmed by [facility] MD, Barbara CowerJASON [id number]) on 07/21/2015 7:15:58 AM      MDM   Final diagnoses:  Cardiac arrest (HCC)  Cardiopulmonary arrest (HCC)  Acute respiratory failure with hypoxia (HCC)  Complete heart block (HCC)   70 year old male arrived to the emergency department the back of a truck with chief complaint of unresponsiveness and was immediately found to be pulseless. Chest compressions were started by technician was immediately upon arrival in the parking lot. Patient had fallen out of the truck when the door was opened. On my arrival with chest compressions were in progress had multiple abrasions to his face and body along with skin tears. Head was ashen color and still did not have a pulse chest compressions were continued the patient was loaded onto the stretcher and had another fall during that effort secondary to his body habitus and  difficulty getting him on the stretcher. Chest compressions were continued and epinephrine was administered as soon as he was brought into the resuscitation bay. For details of the code please refer to the code sheet. In short, patient received multiple rounds of epinephrine, magnesium, calcium, bicarbonate. I considered tPA 2/2 his recent surgery (possible PE) and likely cardiac history (midline scar, possibly ACS) however we had continued ROSC.  We had ROSC after about 10 minutes of compressions. This lasted approximately 3 minutes and then they lost pulses again for approximately 5 minutes however regain pulses and patient did not lose pulses again. He did have 1 episode where he went into V. tach and was cardioverted as per the procedure note above and given bolus of amiodarone. Central line was placed secondary to very difficult vascular access aside from the IO that we placed during the code. Patient intubated with glidescope as documented above. Initial EKG showed what looked like a complete heart block. There was no evidence of ST elevation however secondary to PEA as his initial rhythm, subsequent vfib he was started on hypothermia protocol. Patient was discussed with ICU team and the plan for immediate transfer to the ICU for further workup and management. No significant events afterwards.     Marily MemosJason Luba Matzen, MD 07/09/2015 1542  Marily MemosJason Shyla Gayheart, MD 07/21/15 [phone]  Marily MemosJason Davell Beckstead, MD 07/21/15 [phone]

## 2015-07-20 NOTE — Progress Notes (Signed)
eLink Physician-Brief Progress Note Patient Name: Stephen PortelaRobert [name] DOB: 01/13/1946 MRN: [id number]   Date of Service  07/06/2015  HPI/Events of Note  Notified by neurology of severely abnormal EEG findings. Consistent with anoxic brain injury.  Awaiting CT head.  eICU Interventions  Plan to readdress goals of care with family if patient becomes unstable overnight or tomorrow by rounding physician.     Intervention Category Major Interventions: Other:  Stephen CousinsJennings Malissie [name] 07/11/2015, 6:54 PM

## 2015-07-21 ENCOUNTER — Inpatient Hospital Stay (HOSPITAL_COMMUNITY): Payer: Medicare HMO

## 2015-07-21 ENCOUNTER — Encounter (HOSPITAL_COMMUNITY): Payer: Medicare HMO

## 2015-07-21 DIAGNOSIS — G9340 Encephalopathy, unspecified: Secondary | ICD-10-CM

## 2015-07-21 DIAGNOSIS — J9601 Acute respiratory failure with hypoxia: Secondary | ICD-10-CM

## 2015-07-21 LAB — BLOOD GAS, ARTERIAL
ACID-BASE EXCESS: 1.4 mmol/L (ref 0.0–2.0)
BICARBONATE: 23.3 meq/L (ref 20.0–24.0)
Drawn by: 398661
FIO2: 0.4
LHR: 22 {breaths}/min
O2 SAT: 98.1 %
PCO2 ART: 24.4 mmHg — AB (ref 35.0–45.0)
PEEP: 5 cmH2O
Patient temperature: 98.6
TCO2: 24.1 mmol/L (ref 0–100)
VT: 620 mL
pH, Arterial: 7.587 — ABNORMAL HIGH (ref 7.350–7.450)
pO2, Arterial: 104 mmHg — ABNORMAL HIGH (ref 80.0–100.0)

## 2015-07-21 LAB — BASIC METABOLIC PANEL
ANION GAP: 12 (ref 5–15)
ANION GAP: 13 (ref 5–15)
Anion gap: 13 (ref 5–15)
BUN: 32 mg/dL — ABNORMAL HIGH (ref 6–20)
BUN: 33 mg/dL — AB (ref 6–20)
BUN: 34 mg/dL — AB (ref 6–20)
CALCIUM: 10.5 mg/dL — AB (ref 8.9–10.3)
CALCIUM: 10 mg/dL (ref 8.9–10.3)
CHLORIDE: 102 mmol/L (ref 101–111)
CHLORIDE: 104 mmol/L (ref 101–111)
CO2: 25 mmol/L (ref 22–32)
CO2: 25 mmol/L (ref 22–32)
CO2: 25 mmol/L (ref 22–32)
CREATININE: 2.48 mg/dL — AB (ref 0.61–1.24)
Calcium: 10.8 mg/dL — ABNORMAL HIGH (ref 8.9–10.3)
Chloride: 103 mmol/L (ref 101–111)
Creatinine, Ser: 2.15 mg/dL — ABNORMAL HIGH (ref 0.61–1.24)
Creatinine, Ser: 2.28 mg/dL — ABNORMAL HIGH (ref 0.61–1.24)
GFR calc Af Amer: 34 mL/min — ABNORMAL LOW (ref >60)
GFR calc Af Amer: 31 mL/min — ABNORMAL LOW (ref >60)
GFR, EST AFRICAN AMERICAN: 28 mL/min — AB (ref >60)
GFR, EST NON AFRICAN AMERICAN: 24 mL/min — AB (ref >60)
GFR, EST NON AFRICAN AMERICAN: 27 mL/min — AB (ref >60)
GFR, EST NON AFRICAN AMERICAN: 29 mL/min — AB (ref >60)
GLUCOSE: 197 mg/dL — AB (ref 65–99)
GLUCOSE: 227 mg/dL — AB (ref 65–99)
Glucose, Bld: 220 mg/dL — ABNORMAL HIGH (ref 65–99)
POTASSIUM: 3.5 mmol/L (ref 3.5–5.1)
Potassium: 3.6 mmol/L (ref 3.5–5.1)
Potassium: 3.6 mmol/L (ref 3.5–5.1)
Sodium: 142 mmol/L (ref 135–145)
Sodium: 140 mmol/L (ref 135–145)
Sodium: 140 mmol/L (ref 135–145)

## 2015-07-21 LAB — GLUCOSE, CAPILLARY
GLUCOSE-CAPILLARY: 209 mg/dL — AB (ref 65–99)
GLUCOSE-CAPILLARY: 163 mg/dL — AB (ref 65–99)
GLUCOSE-CAPILLARY: 153 mg/dL — AB (ref 65–99)
Glucose-Capillary: 182 mg/dL — ABNORMAL HIGH (ref 65–99)
Glucose-Capillary: 244 mg/dL — ABNORMAL HIGH (ref 65–99)
Glucose-Capillary: 147 mg/dL — ABNORMAL HIGH (ref 65–99)
Glucose-Capillary: 176 mg/dL — ABNORMAL HIGH (ref 65–99)

## 2015-07-21 LAB — CBC
HCT: 30.9 % — ABNORMAL LOW (ref 39.0–52.0)
Hemoglobin: 10 g/dL — ABNORMAL LOW (ref 13.0–17.0)
MCH: 30.7 pg (ref 26.0–34.0)
MCHC: 32.4 g/dL (ref 30.0–36.0)
MCV: 94.8 fL (ref 78.0–100.0)
PLATELETS: 214 10*3/uL (ref 150–400)
RBC: 3.26 MIL/uL — ABNORMAL LOW (ref 4.22–5.81)
RDW: 14.9 % (ref 11.5–15.5)
WBC: 13.8 10*3/uL — AB (ref 4.0–10.5)

## 2015-07-21 LAB — HEPARIN LEVEL (UNFRACTIONATED)
Heparin Unfractionated: 0.82 IU/mL — ABNORMAL HIGH (ref 0.30–0.70)
Heparin Unfractionated: 0.71 IU/mL — ABNORMAL HIGH (ref 0.30–0.70)

## 2015-07-21 LAB — TRIGLYCERIDES: Triglycerides: 131 mg/dL (ref <150)

## 2015-07-21 LAB — POCT I-STAT 3, ART BLOOD GAS (G3+)
Acid-Base Excess: 3 mmol/L — ABNORMAL HIGH (ref 0.0–2.0)
Bicarbonate: 26.8 mEq/L — ABNORMAL HIGH (ref 20.0–24.0)
O2 Saturation: 98 %
PCO2 ART: 36.9 mmHg (ref 35.0–45.0)
TCO2: 28 mmol/L (ref 0–100)
pH, Arterial: 7.469 — ABNORMAL HIGH (ref 7.350–7.450)
pO2, Arterial: 92 mmHg (ref 80.0–100.0)

## 2015-07-21 LAB — MAGNESIUM
MAGNESIUM: 2.4 mg/dL (ref 1.7–2.4)
Magnesium: 2.2 mg/dL (ref 1.7–2.4)
Magnesium: 2.5 mg/dL — ABNORMAL HIGH (ref 1.7–2.4)

## 2015-07-21 LAB — LACTIC ACID, PLASMA: Lactic Acid, Venous: 2.7 mmol/L (ref 0.5–2.0)

## 2015-07-21 LAB — PHOSPHORUS
PHOSPHORUS: 4.6 mg/dL (ref 2.5–4.6)
Phosphorus: 2 mg/dL — ABNORMAL LOW (ref 2.5–4.6)

## 2015-07-21 LAB — TROPONIN I: Troponin I: 3.04 ng/mL (ref <0.031)

## 2015-07-21 MED ORDER — MAGNESIUM SULFATE IN D5W 1-5 GM/100ML-% IV SOLN
1.0000 g | Freq: Once | INTRAVENOUS | Status: AC
  Administered 2015-07-21: 1 g via INTRAVENOUS
  Filled 2015-07-21: qty 100

## 2015-07-21 MED ORDER — POTASSIUM CHLORIDE 20 MEQ/15ML (10%) PO SOLN
40.0000 meq | Freq: Once | ORAL | Status: AC
  Administered 2015-07-21: 40 meq via ORAL

## 2015-07-21 MED ORDER — AMIODARONE HCL IN DEXTROSE 360-4.14 MG/200ML-% IV SOLN
INTRAVENOUS | Status: AC
  Filled 2015-07-21: qty 200

## 2015-07-21 MED ORDER — POTASSIUM CHLORIDE 10 MEQ/50ML IV SOLN
10.0000 meq | INTRAVENOUS | Status: AC
  Administered 2015-07-21 (×4): 10 meq via INTRAVENOUS
  Filled 2015-07-21 (×4): qty 50

## 2015-07-21 MED ORDER — HEPARIN BOLUS VIA INFUSION
4000.0000 [IU] | Freq: Once | INTRAVENOUS | Status: AC
  Administered 2015-07-21: 4000 [IU] via INTRAVENOUS
  Filled 2015-07-21: qty 4000

## 2015-07-21 MED ORDER — POTASSIUM CHLORIDE CRYS ER 20 MEQ PO TBCR: 40.0000 meq | EXTENDED_RELEASE_TABLET | Freq: Once | ORAL | Status: DC

## 2015-07-21 MED ORDER — POTASSIUM CHLORIDE 20 MEQ PO PACK: 40.0000 meq | PACK | Freq: Once | ORAL | Status: DC

## 2015-07-21 MED ORDER — POTASSIUM CHLORIDE 20 MEQ/15ML (10%) PO SOLN: 40.0000 meq | Freq: Every day | ORAL | Status: DC

## 2015-07-21 MED ORDER — SODIUM CHLORIDE 0.9 % IV SOLN: INTRAVENOUS | Status: DC

## 2015-07-21 MED ORDER — HEPARIN (PORCINE) IN NACL 100-0.45 UNIT/ML-% IJ SOLN
1300.0000 [IU]/h | INTRAMUSCULAR | Status: DC
  Administered 2015-07-21: 1600 [IU]/h via INTRAVENOUS
  Administered 2015-07-21: 1300 [IU]/h via INTRAVENOUS
  Administered 2015-07-22: 1200 [IU]/h via INTRAVENOUS
  Filled 2015-07-21 (×4): qty 250

## 2015-07-21 MED ORDER — SODIUM PHOSPHATES 45 MMOLE/15ML IV SOLN
30.0000 mmol | Freq: Once | INTRAVENOUS | Status: AC
  Administered 2015-07-21: 30 mmol via INTRAVENOUS
  Filled 2015-07-21: qty 10

## 2015-07-21 MED ORDER — SODIUM CHLORIDE 0.9% FLUSH
10.0000 mL | Freq: Two times a day (BID) | INTRAVENOUS | Status: DC
  Administered 2015-07-21 – 2015-07-22 (×2): 10 mL

## 2015-07-21 MED ORDER — SODIUM CHLORIDE 0.9% FLUSH: 10.0000 mL | INTRAVENOUS | Status: DC | PRN

## 2015-07-21 MED ORDER — POTASSIUM CHLORIDE 20 MEQ/15ML (10%) PO SOLN
ORAL | Status: AC
  Filled 2015-07-21: qty 30

## 2015-07-21 NOTE — Progress Notes (Signed)
eLink Physician-Brief Progress Note Patient Name: Stephen PortelaRobert T Tauer DOB: 09/30/1945 MRN: [id number]   Date of Service  07/21/2015  HPI/Events of Note  Patient with resp alkalosis on vent with pH 7.58/24.4/104/23  eICU Interventions  Plan: Decrease TV to 550 cc Decrease RR to 18 Recheck ABG in 2 hours post vent change     Intervention Category Major Interventions: Acid-Base disturbance - evaluation and management  Myers Tutterow 07/21/2015, 5:14 AM

## 2015-07-21 NOTE — Progress Notes (Signed)
ANTICOAGULATION CONSULT NOTE - Initial Consult  Pharmacy Consult for Heparin  Indication: chest pain/ACS  Allergies  Allergen Reactions  . Codeine Other (See Comments)    Hallucinations/ paranoia    Patient Measurements: Height: 6' (182.9 cm) Weight: (!) 339 lb 8.1 oz (154 kg) IBW/kg (Calculated) : 77.6  Vital Signs: Temp: 99.2 F (37.3 C) (04/30 0003) Temp Source: Oral (04/30 0003) BP: 102/69 mmHg (04/30 0003) Pulse Rate: 76 (04/30 0003)  Labs:  Recent Labs  06/28/2015 1235 07/07/2015 1241 07/03/2015 1530 07/18/2015 2200  HGB 10.0* 11.6*  --   --   HCT 32.8* 34.0*  --   --   PLT 173  --   --   --   LABPROT 14.6  --   --   --   INR 1.12  --   --   --   CREATININE 1.69* 1.30*  --   --   TROPONINI 0.03  --  2.12* 4.02*    Estimated Creatinine Clearance: 78.6 mL/min (by C-G formula based on Cr of 1.3).   Medical History: Past Medical History  Diagnosis Date  . Hyperlipidemia   . Hypertension   . Sleep apnea, obstructive     on CPAP  . BPH (benign prostatic hyperplasia)   . Glaucoma   . Cataract   . Arthritis   . Anxiety   . Depression 03/24/2003    maintained on Symbyax.  Hospitalization in 2002 for one week.   . CHF (congestive heart failure) (HCC)     echo 06/12/13 normal EF; diastolic function unable to be assesed.  [facility]12[facility]13 [facility]  . Diabetes mellitus without complication [facility](HCC)     Assessment: 70 [facility] pt is s/p CPR with ROSC, rising troponin, starting heparin per pharmacy, Hgb 11.6, he did receive 2 doses of subcutaneous heparin but will proceed with full dose bolus given patient's weight.   Goal of Therapy:  Heparin level 0.3-0.7 units/ml Monitor platelets by anticoagulation protocol: Yes   Plan:  -Heparin 4000 units BOLUS -Start heparin drip at 1600 units/hr -0900 HL -Daily CBC/HL -Monitor for bleeding  Abran DukeLedford, Charlye Spare 07/21/2015,12:27 AM

## 2015-07-21 NOTE — Progress Notes (Signed)
Extensive discussion with family regarding CT of the head findings (negative acute) and neurologic status.  Informed them that a negative CT of the head does not mean that he did not have injury from events surrounding admission.  Discussed reassessment this afternoon with decreasing sedation and again in the am.  They are hopeful that he will recover but do not want to put him through unnecessary / futile therapy that would not return him to his previous functional state.  Informed them of the need for serial neuro examinations.  Will need to update family with exams / discuss clinical progress.   Canary BrimBrandi Stokes Rattigan, NP-C [facility] Pgr: [phone] or if no answer [phone] 07/21/2015, 4:43 PM

## 2015-07-21 NOTE — Progress Notes (Signed)
Utilization review completed.  

## 2015-07-21 NOTE — H&P (Deleted)
PULMONARY / CRITICAL CARE MEDICINE   Name: Stephen PortelaRobert [name] MRN: [id number] DOB: 04/11/1945    ADMISSION DATE:  03/03/2016 CONSULTATION DATE:  07/19/2015  REFERRING MD: [facility].   SUBJECTIVE: RN reports ongoing mild myoclonus noted of mouth, remains on propofol at 20 mcg/kg/min.  No purposeful movements noted per staff.   VITAL SIGNS: BP 110/55 mmHg  Pulse 63  Temp(Src) 99.5 F (37.5 C) (Oral)  Resp 18  Ht 6' (1.829 m)  Wt 339 lb 8.1 oz (154 kg)  BMI 46.04 kg/m2  SpO2 100%  HEMODYNAMICS:    VENTILATOR SETTINGS: Vent Mode:  [-] PRVC FiO2 (%):  [30 %-100 %] 30 % Set Rate:  [18 bmp-22 bmp] 18 bmp Vt Set:  [550 mL-620 mL] 550 mL PEEP:  [5 cmH20] 5 cmH20 Plateau Pressure:  [18 cmH20-29 cmH20] 18 cmH20  INTAKE / OUTPUT: I/O last 3 completed shifts: In: 4606.2 [I.V.:3296.2; IV Piggyback:1310] Out: 300 [Urine:300]  PHYSICAL EXAMINATION: General:  Obese male lying in bed, no acute distress Neuro:  Myoclonic jerks. No response to verbal stimuli HEENT:  PERRL, No JVD Cardiovascular:  s1s2 rrr, no m/r/g Lungs:  RRR, NO MRG Abdomen:  Obese, distended. Musculoskeletal:  No acute deformities  Skin:  Intact.  LABS:  BMET  Recent Labs Lab 07/21/15 0045 07/21/15 0309 07/21/15 0900  NA 142 140 140  K 3.5 3.6 3.6  CL 104 103 102  CO2 25 25 25   BUN 32* 33* 34*  CREATININE 2.15* 2.28* 2.48*  GLUCOSE 197* 227* 220*    Electrolytes  Recent Labs Lab 004-29-17 1530 07/21/15 0045 07/21/15 0309 07/21/15 0900  CALCIUM  --  10.8* 10.5* 10.0  MG 2.4 2.2 2.5* 2.4  PHOS 5.8* 2.0*  --  4.6    CBC  Recent Labs Lab 004-29-17 1235 004-29-17 1241 07/21/15 0340  WBC 9.4  --  13.8*  HGB 10.0* 11.6* 10.0*  HCT 32.8* 34.0* 30.9*  PLT 173  --  214    Coag's  Recent Labs Lab 004-29-17 1235  INR 1.12    Sepsis Markers  Recent Labs Lab 07/21/15 0309  LATICACIDVEN 2.7*    ABG  Recent Labs Lab 004-29-17 1650 07/21/15 0410 07/21/15 0819  PHART  7.441 7.587* 7.469*  PCO2ART 38.3 24.4* 36.9  PO2ART 178.0* 104* 92.0    Liver Enzymes  Recent Labs Lab 004-29-17 1235  AST 68*  ALT 53  ALKPHOS 51  BILITOT 0.4  ALBUMIN 2.4*    Cardiac Enzymes  Recent Labs Lab 004-29-17 1530 004-29-17 2200 07/21/15 0340  TROPONINI 2.12* 4.02* 3.04*    Glucose  Recent Labs Lab 004-29-17 1955 004-29-17 2201 07/21/15 0033 07/21/15 0048 07/21/15 0332 07/21/15 0728  GLUCAP 320* 236* 244* 182* 209* 163*    Imaging Dg Chest Port 1 View  07/21/2015  CLINICAL DATA:  70 year old male with history of acute respiratory failure. EXAM: PORTABLE CHEST 1 VIEW COMPARISON:  Chest x-ray 012/02/2016. FINDINGS: An endotracheal tube is in place with tip 3.3 cm above the carina. A nasogastric tube is seen extending into the stomach, however, the tip of the nasogastric tube extends below the lower margin of the image. Lung volumes are low. There are bibasilar opacities (left greater than right), which may reflect areas of atelectasis and/or airspace consolidation. Small left pleural effusion. Vascular crowding, accentuated by low lung volumes, without frank pulmonary edema. Mild cardiomegaly. Upper mediastinal contours are distorted by patient positioning, but appear similar to the prior study. Orthopedic fixation hardware in the  lower cervical spine. Transcutaneous defibrillator pads projecting over the lower left hemithorax. IMPRESSION: 1. Support apparatus, as above. 2. Low lung volumes with probable bibasilar subsegmental atelectasis. 3. Mild cardiomegaly. Electronically Signed   By: Trudie Reed M.D.   On: 07/21/2015 09:27   Dg Chest Portable 1 View  2015/08/08  CLINICAL DATA:  CPR EXAM: PORTABLE CHEST 1 VIEW COMPARISON:  05/29/2015 FINDINGS: Endotracheal tube placed. Tip is 3.9 cm from the carina. Low lung volumes. Cardiomegaly. Lungs are grossly clear. IMPRESSION: Endotracheal tube is 3.9 cm from the carina Cardiomegaly and low lung volumes. Electronically  Signed   By: Jolaine Click M.D.   On: Aug 08, 2015 13:15   Dg Abd Portable 1v  Aug 08, 2015  CLINICAL DATA:  Orogastric tube placement EXAM: PORTABLE ABDOMEN - 1 VIEW COMPARISON:  None. FINDINGS: Numerous devices overlie the left upper quadrant and it is very difficult to confidently identify the tip of the orogastric tube. The tip appears to cross the gastroesophageal junction with side hole projecting over gastric air bubble. IMPRESSION: Orogastric tube difficult to visualize in its entirety but appears to project over the stomach. Electronically Signed   By: Esperanza Heir M.D.   On: 2015-08-08 16:31   STUDIES:  LE Doppler 4/29 >>  EEG 4/29 >> burst suppression with epileptiform appearing bursts  CULTURES: Bcx 4/29 >>  Ucx 4/29 >>  Sp Cx 4/29 >>  ANTIBIOTICS: Vanco 4/29 >>  Zosyn 4/29 >>   SIGNIFICANT EVENTS: 4/29  Admit after cardiac arrest   LINES/TUBES: ETT 4/29 >>   DISCUSSION: 70 Y/O with multiple medical co morbidities. Admitted after arrest at home. Found to be in PEA/Vfib. Rhythm on EKG concerning for complete heart block, LBBB concerning for acute coronary syndrome. He has myoclonus on examination with signs of anoxic brain injury. He went about 8 min without CPR.   ASSESSMENT / PLAN:  PULMONARY A: VDRF Recent surgery. Concern for PE P:   Continue vent support, PRVC 8 cc/kg Wean PEEP / FiO2 for sats > 90% Intermittent CXR, ABG LE dopplers  CARDIOVASCULAR A:  High grade AV block LBBB Concern for ACS P:  Cardiology consult ICU monitoring  Repeat EKG. Follow troponins DNR in the event of arrest   RENAL A:   AKI post arrest P:   Follow urine output and Cr Trend BMP  Replace electrolytes as indicated   GASTROINTESTINAL A:   Obesity  P:   Keep NPO  HEMATOLOGIC A:   Anemia  P:  Monitor CBC Heparin gtt per pharmacy   INFECTIOUS A:   Recent back surgery with purulent discharge P:   Cover with vanco, zosyn Check cultures,  procalcitonin Cultures as above  ENDOCRINE A:   DM P:   SSI  NEUROLOGIC A:   Concern for Anoxic injury Myoclonic jerks Not a hypothermia candidate due to prolonged time to ROSC P:   Sedation for myoclonus  CT head pending >>  EEG as above  FAMILY  - Updates: Await CT of head to update family   - Inter-disciplinary family meet or Palliative Care meeting due by:  5/6  Canary Brim, NP-C [facility] Pgr: [phone] or if no answer [phone] 07/21/2015, 11:39 AM

## 2015-07-21 NOTE — Progress Notes (Signed)
MD ordered to stopped sedation (propofol) and see how patient reacts.  Propofol stopped at 1530.  Within , pt started having seizure like activity, shaking from trunk up, and eyes open staring out into distance.  No purpose movement noted, and unable to follow commands.  Ordered to increase propofol until seizure activity stopped.  Increased propofol by until seizure activity stopped at .  Ordered to leave dose here and will reevaluate in the morning.  Family update on pt's condition by MD and by RN.  Emotional support provided.  Will continue to monitor closely and update as needed.

## 2015-07-21 NOTE — Progress Notes (Signed)
eLink Physician-Brief Progress Note Patient Name: Stephen PortelaRobert [name] DOB: 02/04/1946 MRN: [id number]   Date of Service  07/21/2015  HPI/Events of Note  Runs of NSVT.  K of 3.5 and phos of 2.0.  Had received 1 gm of Mag earlier.  Mag level of 2.2  eICU Interventions  Plan: Replace K and Phos     Intervention Category Intermediate Interventions: Arrhythmia - evaluation and management  July Nickson 07/21/2015, 1:49 AM

## 2015-07-21 NOTE — Progress Notes (Signed)
Cardiologist: Dr. Radford Pax (new) Subjective:  Sedate on Vent  Objective:  Vital Signs in the last 24 hours: Temp:  [97.4 F (36.3 C)-99.2 F (37.3 C)] 98.5 F (36.9 C) (04/30 0730) Pulse Rate:  [32-129] 66 (04/30 0900) Resp:  [15-25] 18 (04/30 0900) BP: (82-137)/(39-90) 109/53 mmHg (04/30 0900) SpO2:  [99 %-100 %] 100 % (04/30 0900) FiO2 (%):  [40 %-100 %] 40 % (04/30 0823) Weight:  [339 lb 8.1 oz (154 kg)] 339 lb 8.1 oz (154 kg) (04/29 1400)  Intake/Output from previous day: 04/29 0701 - 04/30 0700 In: 4606.2 [I.V.:3296.2; IV Piggyback:1310] Out: 300 [Urine:300]   Physical Exam: General: Ill appearing on vent. Head:  Normocephalic and atraumatic. Lungs: Clear to auscultation and percussion. Vent noise Heart: Normal S1 and S2.  No murmur, rubs or gallops.  Abdomen: soft, non-tender, positive bowel sounds. Obese Extremities: No clubbing or cyanosis. No edema. Neurologic: sedate    Lab Results:  Recent Labs  06/27/2015 1235 07/12/2015 1241 07/21/15 0340  WBC 9.4  --  13.8*  HGB 10.0* 11.6* 10.0*  PLT 173  --  214    Recent Labs  07/21/15 0309 07/21/15 0900  NA 140 140  K 3.6 3.6  CL 103 102  CO2 25 25  GLUCOSE 227* 220*  BUN 33* 34*  CREATININE 2.28* 2.48*    Recent Labs  07/21/2015 2200 07/21/15 0340  TROPONINI 4.02* 3.04*   Hepatic Function Panel  Recent Labs  06/25/2015 1235  PROT 6.2*  ALBUMIN 2.4*  AST 68*  ALT 53  ALKPHOS 51  BILITOT 0.4   No results for input(s): CHOL in the last 72 hours. No results for input(s): PROTIME in the last 72 hours.  Imaging: Dg Chest Port 1 View  07/21/2015  CLINICAL DATA:  70 year old male with history of acute respiratory failure. EXAM: PORTABLE CHEST 1 VIEW COMPARISON:  Chest x-ray 06/23/2015. FINDINGS: An endotracheal tube is in place with tip 3.3 cm above the carina. A nasogastric tube is seen extending into the stomach, however, the tip of the nasogastric tube extends below the lower margin of the  image. Lung volumes are low. There are bibasilar opacities (left greater than right), which may reflect areas of atelectasis and/or airspace consolidation. Small left pleural effusion. Vascular crowding, accentuated by low lung volumes, without frank pulmonary edema. Mild cardiomegaly. Upper mediastinal contours are distorted by patient positioning, but appear similar to the prior study. Orthopedic fixation hardware in the lower cervical spine. Transcutaneous defibrillator pads projecting over the lower left hemithorax. IMPRESSION: 1. Support apparatus, as above. 2. Low lung volumes with probable bibasilar subsegmental atelectasis. 3. Mild cardiomegaly. Electronically Signed   By: Vinnie Langton M.D.   On: 07/21/2015 09:27   Dg Chest Portable 1 View  07/04/2015  CLINICAL DATA:  CPR EXAM: PORTABLE CHEST 1 VIEW COMPARISON:  05/29/2015 FINDINGS: Endotracheal tube placed. Tip is 3.9 cm from the carina. Low lung volumes. Cardiomegaly. Lungs are grossly clear. IMPRESSION: Endotracheal tube is 3.9 cm from the carina Cardiomegaly and low lung volumes. Electronically Signed   By: Marybelle Killings M.D.   On: 07/06/2015 13:15   Dg Abd Portable 1v  07/13/2015  CLINICAL DATA:  Orogastric tube placement EXAM: PORTABLE ABDOMEN - 1 VIEW COMPARISON:  None. FINDINGS: Numerous devices overlie the left upper quadrant and it is very difficult to confidently identify the tip of the orogastric tube. The tip appears to cross the gastroesophageal junction with side hole projecting over gastric air bubble. IMPRESSION: Orogastric tube  difficult to visualize in its entirety but appears to project over the stomach. Electronically Signed   By: Skipper Cliche M.D.   On: 07/11/2015 16:31   Personally viewed.   Telemetry: Polymorphic VT at 1 am on 4/30. Non sustained. 15 beats. Personally viewed.   EKG:  NSR, LBBB, first degree AVB Personally viewed.  Cardiac Studies:  ECHO in 2015 - EF normal  Meds: Scheduled Meds: . amiodarone       . antiseptic oral rinse  7 mL Mouth Rinse 10 times per day  . aspirin  150 mg Rectal Daily  . chlorhexidine gluconate (SAGE KIT)  15 mL Mouth Rinse BID  . insulin aspart  0-15 Units Subcutaneous Q4H  . pantoprazole (PROTONIX) IV  40 mg Intravenous QHS  . piperacillin-tazobactam (ZOSYN)  IV  3.375 g Intravenous Q8H  . sodium chloride flush  10-40 mL Intracatheter Q12H  . vancomycin  1,250 mg Intravenous Q12H   Continuous Infusions: . heparin 1,600 Units/hr (07/21/15 0700)  . norepinephrine (LEVOPHED) Adult infusion 6 mcg/min (07/21/15 1000)  . propofol (DIPRIVAN) infusion 20 mcg/kg/min (07/21/15 1100)   PRN Meds:.bisacodyl, fentaNYL (SUBLIMAZE) injection, fentaNYL (SUBLIMAZE) injection, sodium chloride flush  Assessment/Plan:  Active Problems:   Cardiopulmonary arrest (HCC)   Cardiac arrest (HCC)   Complete heart block (HCC)   Acute respiratory failure with hypoxia (HCC)   Acute encephalopathy  Polymorphic VT, non sustained  - K replaced  - Mag replaced  - Can not tolerate Bb due to hypotension, on levophed  Cardiac arrest  - Likely VT/VF   - Poor prognostic signs.   - Not felt to be cath candidate given current neuro state  - Trop 4 now 3   Anoxic brain injury  - getting CT. Will help with judgement ? Withdrawal. Wife present.   - Neuro note reviewed.   Acute renal failure  - in setting of poor perfusion  Acute respiratory failure  - on Vent, CCM  SKAINS, MARK 07/21/2015, 11:21 AM

## 2015-07-21 NOTE — Progress Notes (Signed)
ANTICOAGULATION CONSULT NOTE  Pharmacy Consult for Heparin  Indication: chest pain/ACS  Allergies  Allergen Reactions  . Codeine Other (See Comments)    Hallucinations/ paranoia    Patient Measurements: Height: 6' (182.9 cm) Weight: (!) 339 lb 8.1 oz (154 kg) IBW/kg (Calculated) : 77.6  Vital Signs: Temp: 99.1 F (37.3 C) (04/30 1600) Temp Source: Oral (04/30 1600) BP: 106/56 mmHg (04/30 1900) Pulse Rate: 69 (04/30 1927)  Labs:  Recent Labs  06/27/2015 1235 07/15/2015 1241 07/08/2015 1530 06/30/2015 2200 07/21/15 0045 07/21/15 0309 07/21/15 0340 07/21/15 0900 07/21/15 0917 07/21/15 2142  HGB 10.0* 11.6*  --   --   --   --  10.0*  --   --   --   HCT 32.8* 34.0*  --   --   --   --  30.9*  --   --   --   PLT 173  --   --   --   --   --  214  --   --   --   LABPROT 14.6  --   --   --   --   --   --   --   --   --   INR 1.12  --   --   --   --   --   --   --   --   --   HEPARINUNFRC  --   --   --   --   --   --   --   --  0.82* 0.71*  CREATININE 1.69* 1.30*  --   --  2.15* 2.28*  --  2.48*  --   --   TROPONINI 0.03  --  2.12* 4.02*  --   --  3.04*  --   --   --     Estimated Creatinine Clearance: 41.2 mL/min (by C-G formula based on Cr of 2.48).  Assessment: 70 y.o. male  with ACS s/p cardiac arrest for heparin   Goal of Therapy:  Heparin level 0.3-0.7 units/ml Monitor platelets by anticoagulation protocol: Yes   Plan:  Decrease Heparin 1200 units/hr Follow-up am labs.  Geannie RisenGreg Codi Folkerts, PharmD, BCPS  07/21/2015 11:01 PM

## 2015-07-21 NOTE — Progress Notes (Signed)
CRITICAL VALUE ALERT  Critical value received:  Lactic 2.7  Date of notification:  07/21/2015  Time of notification:  0415  Critical value read back:Yes.    Nurse who received alert:  Marco Collieharis Moise Friday RN  MD notified (1st page):  Deter[place]  Time of first page:  [phone]  Responding MD:  Deterding  Time MD responded:  [phone]

## 2015-07-21 NOTE — Progress Notes (Signed)
PULMONARY / CRITICAL CARE MEDICINE   Name: STEDMAN SUMMERVILLE MRN: [id number] DOB: October 30, 1945    ADMISSION DATE:  06/30/2015 CONSULTATION DATE:  07/06/2015  REFERRING MD: [facility].   SUBJECTIVE: RN reports ongoing mild myoclonus noted of mouth, remains on propofol at 20 mcg/kg/min.  No purposeful movements noted per staff.   VITAL SIGNS: BP 110/55 mmHg  Pulse 63  Temp(Src) 99.5 F (37.5 C) (Oral)  Resp 18  Ht 6' (1.829 m)  Wt 339 lb 8.1 oz (154 kg)  BMI 46.04 kg/m2  SpO2 100%  HEMODYNAMICS:    VENTILATOR SETTINGS: Vent Mode:  [-] PRVC FiO2 (%):  [30 %-100 %] 30 % Set Rate:  [18 bmp-22 bmp] 18 bmp Vt Set:  [550 mL-620 mL] 550 mL PEEP:  [5 cmH20] 5 cmH20 Plateau Pressure:  [18 cmH20-29 cmH20] 18 cmH20  INTAKE / OUTPUT: I/O last 3 completed shifts: In: 4606.2 [I.V.:3296.2; IV Piggyback:1310] Out: 300 [Urine:300]  PHYSICAL EXAMINATION: General:  Obese male lying in bed, no acute distress Neuro:  Myoclonic jerks. No response to verbal stimuli HEENT:  PERRL, No JVD Cardiovascular:  s1s2 rrr, no m/r/g Lungs:  RRR, NO MRG Abdomen:  Obese, distended. Musculoskeletal:  No acute deformities  Skin:  Intact.  LABS:  BMET  Recent Labs Lab 07/21/15 0045 07/21/15 0309 07/21/15 0900  NA 142 140 140  K 3.5 3.6 3.6  CL 104 103 102  CO2 BUN 32* 33* 34*  CREATININE 2.15* 2.28* 2.48*  GLUCOSE 197* 227* 220*    Electrolytes  Recent Labs Lab 07/07/2015 1530 07/21/15 0045 07/21/15 0309 07/21/15 0900  CALCIUM  --  10.8* 10.5* 10.0  MG 2.4 2.2 2.5* 2.4  PHOS 5.8* 2.0*  --  4.6    CBC  Recent Labs Lab 07/05/2015 1235 07/14/2015 1241 07/21/15 0340  WBC 9.4  --  13.8*  HGB 10.0* 11.6* 10.0*  HCT 32.8* 34.0* 30.9*  PLT 173  --  214    Coag's  Recent Labs Lab 06/24/2015 1235  INR 1.12    Sepsis Markers  Recent Labs Lab 07/21/15 0309  LATICACIDVEN 2.7*    ABG  Recent Labs Lab 07/04/2015 1650 07/21/15 0410 07/21/15 0819  PHART  7.441 7.587* 7.469*  PCO2ART 38.3 24.4* 36.9  PO2ART 178.0* 104* 92.0    Liver Enzymes  Recent Labs Lab 07/18/2015 1235  AST 68*  ALT 53  ALKPHOS 51  BILITOT 0.4  ALBUMIN 2.4*    Cardiac Enzymes  Recent Labs Lab 06/30/2015 1530 06/24/2015 2200 07/21/15 0340  TROPONINI 2.12* 4.02* 3.04*    Glucose  Recent Labs Lab 07/13/2015 2201 07/21/15 0033 07/21/15 0048 07/21/15 0332 07/21/15 0728 07/21/15 1121  GLUCAP 236* 244* 182* 209* 163* 147*    Imaging Dg Chest Port 1 View  07/21/2015  CLINICAL DATA:  70 year old male with history of acute respiratory failure. EXAM: PORTABLE CHEST 1 VIEW COMPARISON:  Chest x-ray 07/08/2015. FINDINGS: An endotracheal tube is in place with tip 3.3 cm above the carina. A nasogastric tube is seen extending into the stomach, however, the tip of the nasogastric tube extends below the lower margin of the image. Lung volumes are low. There are bibasilar opacities (left greater than right), which may reflect areas of atelectasis and/or airspace consolidation. Small left pleural effusion. Vascular crowding, accentuated by low lung volumes, without frank pulmonary edema. Mild cardiomegaly. Upper mediastinal contours are distorted by patient positioning, but appear similar to the prior study. Orthopedic fixation hardware in the  lower cervical spine. Transcutaneous defibrillator pads projecting over the lower left hemithorax. IMPRESSION: 1. Support apparatus, as above. 2. Low lung volumes with probable bibasilar subsegmental atelectasis. 3. Mild cardiomegaly. Electronically Signed   By: Trudie Reedaniel  Entrikin M.D.   On: 07/21/2015 09:27   Dg Chest Portable 1 View  12/29/15  CLINICAL DATA:  CPR EXAM: PORTABLE CHEST 1 VIEW COMPARISON:  05/29/2015 FINDINGS: Endotracheal tube placed. Tip is 3.9 cm from the carina. Low lung volumes. Cardiomegaly. Lungs are grossly clear. IMPRESSION: Endotracheal tube is 3.9 cm from the carina Cardiomegaly and low lung volumes. Electronically  Signed   By: Jolaine ClickArthur  Hoss M.D.   On: 010/08/17 13:15   Dg Abd Portable 1v  12/29/15  CLINICAL DATA:  Orogastric tube placement EXAM: PORTABLE ABDOMEN - 1 VIEW COMPARISON:  None. FINDINGS: Numerous devices overlie the left upper quadrant and it is very difficult to confidently identify the tip of the orogastric tube. The tip appears to cross the gastroesophageal junction with side hole projecting over gastric air bubble. IMPRESSION: Orogastric tube difficult to visualize in its entirety but appears to project over the stomach. Electronically Signed   By: Esperanza Heiraymond  Rubner M.D.   On: 010/08/17 16:31   STUDIES:  LE Doppler 4/29 >>  EEG 4/29 >> burst suppression with epileptiform appearing bursts  CULTURES: Bcx 4/29 >>  Ucx 4/29 >>  Sp Cx 4/29 >>  ANTIBIOTICS: Vanco 4/29 >>  Zosyn 4/29 >>   SIGNIFICANT EVENTS: 4/29  Admit after cardiac arrest   LINES/TUBES: ETT 4/29 >>   DISCUSSION: 70 Y/O with multiple medical co morbidities. Admitted after arrest at home. Found to be in PEA/Vfib. Rhythm on EKG concerning for complete heart block, LBBB concerning for acute coronary syndrome. He has myoclonus on examination with signs of anoxic brain injury. He went about 8 min without CPR.   ASSESSMENT / PLAN:  PULMONARY A: VDRF Recent surgery. Concern for PE P:   Continue vent support, PRVC 8 cc/kg Wean PEEP / FiO2 for sats > 90% Intermittent CXR, ABG LE dopplers  CARDIOVASCULAR A:  High grade AV block LBBB Concern for ACS P:  Cardiology consult ICU monitoring  Repeat EKG. Follow troponins DNR in the event of arrest   RENAL A:   AKI post arrest P:   Follow urine output and Cr Trend BMP  Replace electrolytes as indicated   GASTROINTESTINAL A:   Obesity  P:   Keep NPO  HEMATOLOGIC A:   Anemia  P:  Monitor CBC Heparin gtt per pharmacy   INFECTIOUS A:   Recent back surgery with purulent discharge P:   Cover with vanco, zosyn Check cultures,  procalcitonin Cultures as above  ENDOCRINE A:   DM P:   SSI  NEUROLOGIC A:   Concern for Anoxic injury Myoclonic jerks Not a hypothermia candidate due to prolonged time to ROSC P:   Sedation for myoclonus  CT head pending >>  EEG as above  FAMILY  - Updates: Await CT of head to update family   - Inter-disciplinary family meet or Palliative Care meeting due by:  5/6  Canary BrimBrandi Ollis, NP-C [facility] Pgr: [phone] or if no answer [phone] 07/21/2015, 11:41 AM  Attending note: I have seen and examined the patient with nurse practitioner/resident and agree with the note. History, labs and imaging reviewed.  70 Y/O with with multiple medical problems admitted with PEA arrest. EEG and myoclonic activity suggestive of severe anoxic injury. Family updated. They want him DNR. He  has a living will that he would not want life sustaining treatments in events of coma, vegetative state. They want to wait for a few more days before making any decision.  Critical care time- 35 mins. This represents my time independent of the NPs time taking care of the patient.  Chilton Greathouse MD [facility] Pager [phone] If no answer or after 3pm call: [phone] 07/21/2015, 5:04 PM

## 2015-07-21 NOTE — Progress Notes (Signed)
ANTICOAGULATION CONSULT NOTE - Initial Consult  Pharmacy Consult for Heparin  Indication: chest pain/ACS  Allergies  Allergen Reactions  . Codeine Other (See Comments)    Hallucinations/ paranoia    Patient Measurements: Height: 6' (182.9 cm) Weight: (!) 339 lb 8.1 oz (154 kg) IBW/kg (Calculated) : 77.6  Vital Signs: Temp: 99.5 F (37.5 C) (04/30 1122) Temp Source: Oral (04/30 1122) BP: 110/55 mmHg (04/30 1122) Pulse Rate: 63 (04/30 1122)  Labs:  Recent Labs  07/21/2015 1235 07/08/2015 1241 07/15/2015 1530 06/25/2015 2200 07/21/15 0045 07/21/15 0309 07/21/15 0340 07/21/15 0900 07/21/15 0917  HGB 10.0* 11.6*  --   --   --   --  10.0*  --   --   HCT 32.8* 34.0*  --   --   --   --  30.9*  --   --   PLT 173  --   --   --   --   --  214  --   --   LABPROT 14.6  --   --   --   --   --   --   --   --   INR 1.12  --   --   --   --   --   --   --   --   HEPARINUNFRC  --   --   --   --   --   --   --   --  0.82*  CREATININE 1.69* 1.30*  --   --  2.15* 2.28*  --  2.48*  --   TROPONINI 0.03  --  2.12* 4.02*  --   --  3.04*  --   --     Estimated Creatinine Clearance: 41.2 mL/min (by C-G formula based on Cr of 2.48).   Medical History: Past Medical History  Diagnosis Date  . Hyperlipidemia   . Hypertension   . Sleep apnea, obstructive     on CPAP  . BPH (benign prostatic hyperplasia)   . Glaucoma   . Cataract   . Arthritis   . Anxiety   . Depression 03/24/2003    maintained on Symbyax.  Hospitalization in 2002 for one week.   . CHF (congestive heart failure) (HCC)     echo 06/12/13 normal EF; diastolic function unable to be assesed.  Cardiol[facility] 06/12/13 [facility] risk for ischemia[facility]  . Diabetes mellitus without complication [facility](HCC)     Assessment: 70 [facility], pt is s/p CPR with ROSC, rising troponin, he received 2 doses of subcutaneous heparin prior to bolus and start of infusion. Now HL is supra-therapeutic at 0.86. CBC stable with Hgb  10.  Goal of Therapy:  Heparin level 0.3-0.7 units/ml Monitor platelets by anticoagulation protocol: Yes   Plan:  - Decrease heparin drip to 1300 units/hr - 2000 HL - Daily CBC/HL - Monitor for bleeding   Sherron MondayAubrey N. Neri Samek, PharmD Clinical Pharmacy Resident Pager: [phone] 07/21/2015 11:42 AM

## 2015-07-22 ENCOUNTER — Inpatient Hospital Stay (HOSPITAL_COMMUNITY): Payer: Medicare HMO

## 2015-07-22 DIAGNOSIS — I469 Cardiac arrest, cause unspecified: Secondary | ICD-10-CM

## 2015-07-22 LAB — GLUCOSE, CAPILLARY
GLUCOSE-CAPILLARY: 154 mg/dL — AB (ref 65–99)
GLUCOSE-CAPILLARY: 140 mg/dL — AB (ref 65–99)
GLUCOSE-CAPILLARY: 124 mg/dL — AB (ref 65–99)
Glucose-Capillary: 130 mg/dL — ABNORMAL HIGH (ref 65–99)
Glucose-Capillary: 132 mg/dL — ABNORMAL HIGH (ref 65–99)
Glucose-Capillary: 143 mg/dL — ABNORMAL HIGH (ref 65–99)

## 2015-07-22 LAB — CBC
HEMATOCRIT: 28.8 % — AB (ref 39.0–52.0)
HEMOGLOBIN: 9.2 g/dL — AB (ref 13.0–17.0)
MCH: 30.6 pg (ref 26.0–34.0)
MCHC: 31.9 g/dL (ref 30.0–36.0)
MCV: 95.7 fL (ref 78.0–100.0)
Platelets: 191 10*3/uL (ref 150–400)
RBC: 3.01 MIL/uL — ABNORMAL LOW (ref 4.22–5.81)
RDW: 15.6 % — ABNORMAL HIGH (ref 11.5–15.5)
WBC: 13.4 10*3/uL — AB (ref 4.0–10.5)

## 2015-07-22 LAB — BASIC METABOLIC PANEL
Anion gap: 12 (ref 5–15)
BUN: 42 mg/dL — AB (ref 6–20)
CHLORIDE: 103 mmol/L (ref 101–111)
CO2: 25 mmol/L (ref 22–32)
Calcium: 9 mg/dL (ref 8.9–10.3)
Creatinine, Ser: 2.92 mg/dL — ABNORMAL HIGH (ref 0.61–1.24)
GFR calc non Af Amer: 20 mL/min — ABNORMAL LOW (ref >60)
GFR, EST AFRICAN AMERICAN: 23 mL/min — AB (ref >60)
Glucose, Bld: 161 mg/dL — ABNORMAL HIGH (ref 65–99)
POTASSIUM: 4.2 mmol/L (ref 3.5–5.1)
SODIUM: 140 mmol/L (ref 135–145)

## 2015-07-22 LAB — URINE CULTURE: Culture: NO GROWTH

## 2015-07-22 LAB — HEPARIN LEVEL (UNFRACTIONATED)
HEPARIN UNFRACTIONATED: 0.54 [IU]/mL (ref 0.30–0.70)
HEPARIN UNFRACTIONATED: 0.63 [IU]/mL (ref 0.30–0.70)

## 2015-07-22 MED ORDER — PRO-STAT SUGAR FREE PO LIQD
60.0000 mL | Freq: Three times a day (TID) | ORAL | Status: DC
  Administered 2015-07-22 – 2015-07-23 (×2): 60 mL via ORAL
  Filled 2015-07-22 (×2): qty 60

## 2015-07-22 MED ORDER — VITAL HIGH PROTEIN PO LIQD
1000.0000 mL | ORAL | Status: DC
  Administered 2015-07-22: 1000 mL

## 2015-07-22 MED ORDER — LORAZEPAM 2 MG/ML IJ SOLN
INTRAMUSCULAR | Status: AC
  Filled 2015-07-22: qty 1

## 2015-07-22 MED ORDER — LORAZEPAM 2 MG/ML IJ SOLN
2.0000 mg | Freq: Once | INTRAMUSCULAR | Status: AC
  Administered 2015-07-22: 2 mg via INTRAVENOUS

## 2015-07-22 NOTE — Progress Notes (Signed)
VASCULAR LAB PRELIMINARY  PRELIMINARY  PRELIMINARY  PRELIMINARY  Bilateral lower extremity venous duplex  completed.    Preliminary report:  Right:  No evidence of DVT, superficial thrombosis, or Baker's cyst.  Left: DVT noted in the distal FV, pop v, and PTV.  Non occlusive in the FV and proximal pop v.  No evidence of superficial thrombosis.  No Baker's cyst.   Lamonte Hartt, RVT 07/22/2015, 1:44 PM

## 2015-07-22 NOTE — Progress Notes (Signed)
       Patient Name: [name] Date of Encounter: 07/22/2015    SUBJECTIVE:Intubated and unresponsive  TELEMETRY:  NST with some V ectopy Filed Vitals:   07/22/15 0700 07/22/15 0740 07/22/15 0800 07/22/15 0900  BP: 97/47  101/79 114/58  Pulse: 67 67 70 74  Temp:   97.6 F (36.4 C)   TempSrc:   Axillary   Resp: 18 18 21 18   Height:      Weight:      SpO2: 98% 98% 100% 100%    Intake/Output Summary (Last 24 hours) at 07/22/15 1005 Last data filed at 07/22/15 0900  Gross per 24 hour  Intake 1959.35 ml  Output    730 ml  Net 1229.35 ml   LABS: Basic Metabolic Panel:  Recent Labs  16/12/9602/30/17 0045 07/21/15 0309 07/21/15 0900 07/22/15 0516  NA 142 140 140 140  K 3.5 3.6 3.6 4.2  CL 104 103 102 103  CO2 25 25 25 25   GLUCOSE 197* 227* 220* 161*  BUN 32* 33* 34* 42*  CREATININE 2.15* 2.28* 2.48* 2.92*  CALCIUM 10.8* 10.5* 10.0 9.0  MG 2.2 2.5* 2.4  --   PHOS 2.0*  --  4.6  --    CBC:  Recent Labs  06/23/2015 1235  07/21/15 0340 07/22/15 0517  WBC 9.4  --  13.8* 13.4*  NEUTROABS 4.6  --   --   --   HGB 10.0*  < > 10.0* 9.2*  HCT 32.8*  < > 30.9* 28.8*  MCV 100.0  --  94.8 95.7  PLT 173  --  214 191  < > = values in this interval not displayed. Cardiac Enzymes:  Recent Labs  06/26/2015 1530 07/04/2015 2200 07/21/15 0340  TROPONINI 2.12* 4.02* 3.04*   BNP: Invalid input(s): POCBNP Hemoglobin A1C: No results for input(s): HGBA1C in the last 72 hours. Fasting Lipid Panel:  Recent Labs  07/21/15 1836  TRIG 131    Radiology/Studies:  No new data  Physical Exam: Blood pressure 114/58, pulse 74, temperature 97.6 F (36.4 C), temperature source Axillary, resp. rate 18, height 6' (1.829 m), weight 339 lb 8.1 oz (154 kg), SpO2 100 %. Weight change:   Wt Readings from Last 3 Encounters:  07/16/2015 339 lb 8.1 oz (154 kg)  05/13/15 335 lb (151.955 kg)  03/24/14 240 lb 6.4 oz (109.045 kg)    No gallop or rub  ASSESSMENT:  Polymorphic VT, non  sustained - K replaced - Mag replaced  Whether this is etiology of presentation is not clear  Cardiac arrest - Likely VT/VF  - Poor prognostic signs.  - Not felt to be cath candidate given current neuro state - Trop 4 now 3   Anoxic brain injury - Discussed with the wife and daughter- Neuro note reviewed.   - Unless there is dramatic improvement invasive workup will not n=be done. Acute renal failure - in setting of poor perfusion  Acute respiratory failure - on Vent, CCM   Plan:  Observation  Selinda EonSigned, Sherley Mckenney III,Rika Daughdrill W 07/22/2015, 10:05 AM

## 2015-07-22 NOTE — Progress Notes (Signed)
Initial Nutrition Assessment  DOCUMENTATION CODES:   Morbid obesity  INTERVENTION:   Initiate Vital High Protein @ 25 ml/hr via OG tube and increase by 10 ml every 4 hours to goal rate of 55 ml/hr.   60 ml Prostat TID.    Tube feeding regimen provides 1920 kcal, 205 grams of protein, and 1103 ml of H2O.    NUTRITION DIAGNOSIS:   Increased nutrient needs related to wound healing as evidenced by estimated needs.  GOAL:   Provide needs based on ASPEN/SCCM guidelines  MONITOR:   I & O's, Vent status, TF tolerance, Skin  REASON FOR ASSESSMENT:   Consult, Ventilator Enteral/tube feeding initiation and management  ASSESSMENT:   70 Y/O with hx of DM, CHF, HTN, sleep apnea. Admitted after arrest at home. Found to be in PEA/Vfib. Rhythm on EKG concerning for complete heart block, LBBB concerning for acute coronary syndrome. He has myoclonus on examination with signs of anoxic brain injury. [facility] had had dehiscence and purulent drainage.    Concern for PE due to recent surgery.   Patient is currently intubated on ventilator support MV: 9.6 L/min Temp (24hrs), Avg:97.7 F (36.5 C), Min:97.3 F (36.3 C), Max:98.4 F (36.9 C)  Propofol: off Labs reviewed CBG's: 132-154 OG tube Nutrition-Focused physical exam completed. Findings are no fat depletion, no muscle depletion, and moderate edema.  Family at bedside reports not recent weight changes, good appetite. Pt sits around a lot.   Diet Order:    NPO  Skin:  Wound (see comment) (Dehisced lumbar surgical wound)  Last BM:  unknown  Height:   Ht Readings from Last 1 Encounters:  07/16/2015 6' (1.829 m)    Weight:   Wt Readings from Last 1 Encounters:  06/29/2015 339 lb 8.1 oz (154 kg)    Ideal Body Weight:  80.9 kg  BMI:  Body mass index is 46.04 kg/(m^2).  Estimated Nutritional Needs:   Kcal:  [id number]  Protein:  >/= 202 grams  Fluid:  > 1.7 L/day  EDUCATION  NEEDS:   No education needs identified at this time  Kendell BaneHeather Fenris Cauble RD, LDN, CNSC [phone] Pager [phone] After Hours Pager

## 2015-07-22 NOTE — Progress Notes (Addendum)
ANTICOAGULATION CONSULT NOTE  Pharmacy Consult for Heparin  Indication: chest pain/ACS  Allergies  Allergen Reactions  . Codeine Other (See Comments)    Hallucinations/ paranoia    Patient Measurements: Height: 6' (182.9 cm) Weight: (!) 339 lb 8.1 oz (154 kg) IBW/kg (Calculated) : 77.6  Vital Signs: Temp: 97.6 F (36.4 C) (05/01 0800) Temp Source: Axillary (05/01 0800) BP: 114/58 mmHg (05/01 0900) Pulse Rate: 74 (05/01 0900)  Labs:  Recent Labs  06/23/2015 1235 06/24/2015 1241 06/25/2015 1530 07/17/2015 2200  07/21/15 0309 07/21/15 0340 07/21/15 0900 07/21/15 0917 07/21/15 2142 07/22/15 0516 07/22/15 0517  HGB 10.0* 11.6*  --   --   --   --  10.0*  --   --   --   --  9.2*  HCT 32.8* 34.0*  --   --   --   --  30.9*  --   --   --   --  28.8*  PLT 173  --   --   --   --   --  214  --   --   --   --  191  LABPROT 14.6  --   --   --   --   --   --   --   --   --   --   --   INR 1.12  --   --   --   --   --   --   --   --   --   --   --   HEPARINUNFRC  --   --   --   --   --   --   --   --  0.82* 0.71* 0.54  --   CREATININE 1.69* 1.30*  --   --   < > 2.28*  --  2.48*  --   --  2.92*  --   TROPONINI 0.03  --  2.12* 4.02*  --   --  3.04*  --   --   --   --   --   < > = values in this interval not displayed.  Estimated Creatinine Clearance: 35 mL/min (by C-G formula based on Cr of 2.92).  Assessment: 70 y.o. male  with ACS s/p cardiac arrest for heparin. HL therapeutic x1 on heparin 1200 units/hr. Hgb 9.2, ptls wnl, no bleeding noted  Goal of Therapy:  Heparin level 0.3-0.7 units/ml Monitor platelets by anticoagulation protocol: Yes   Plan:  Continue heparin at 1200 units/hr  F./u 8 hr HL Daily HL, CBC  Greggory Stallionristy Reyes, PharmD Clinical Pharmacy Resident Pager # [phone] 07/22/2015 9:21 AM   ADDENDUM  Confirmatory HL therapeutic on heparin 1200 units/hr. Continue current rate Follow up HL/CBC with morning labs  Greggory Stallionristy Reyes, PharmD Clinical Pharmacy Resident Pager  # [phone] 07/22/2015 1:18 PM

## 2015-07-22 NOTE — Progress Notes (Signed)
MD aware of pt's DVT in LLE.  Pt already on heparin.  No new orders at this time.

## 2015-07-22 NOTE — Consult Note (Signed)
NEURO HOSPITALIST CONSULT NOTE      Reason for Consult: Prognosis after cardiac arrest    History obtained from:  Chart   HPI:                                                                                                                                          [name] is an 70 Y/O with PMH of hyperlipidemia, HTN, OSA, BPH, glaucoma, CHF and DM who lost consciousness as he was getting into the truck to go to the farmers market. Wife drove the pt to ED which took about 8 or more mins and CPR initiated there. Noted to be in PEA arrest with one episode of V fib on arrival. Resuscitated in ED with about 10 mins to ROSC. He then lost his pulse again and then coded for 5 more minutes. Intubated, CVC placed and on pressors. Transferred to [facility] for further eval. EEG showed burst suppression with myoclonic bursts. CTH was negative. No hypothermia was initiated.    Past Medical History  Diagnosis Date  . Hyperlipidemia   . Hypertension   . Sleep apnea, obstructive     on CPAP  . BPH (benign prostatic hyperplasia)   . Glaucoma   . Cataract   . Arthritis   . Anxiety   . Depression 03/24/2003    maintained on Symbyax.  Hospitalization in 2002 for one week.   . CHF (congestive heart failure) (HCC)     echo 06/12/13 normal EF; diastolic function unable to be assesed.  Cardiolite 06/12/13 low risk for ischemia. [facility].  . Diabetes mellitus without complication [facility])     Past Surgical History  Procedure Laterality Date  . Right heel       (congenital birth defect)  . Spine surgery  03/23/2000    03/23/2001.  Lumbar and cervical spine.  . Foot surgery  03/23/1993    R subtalar joint  . Behavioral health admission  03/23/2000    admission x 7 days.  [facility].    Family History  Problem Relation Age of Onset  . Adopted: Yes      Social History:  reports that he has quit smoking. He quit smokeless tobacco use about 42 years ago. His  smokeless tobacco use included Chew. He reports that he does not drink alcohol or use illicit drugs.  Allergies  Allergen Reactions  . Codeine Other (See Comments)    Hallucinations/ paranoia    MEDICATIONS:  I have reviewed the patient's current medications.   ROS:                                                                                                                                       History obtained from chart review  General ROS: negative for - chills, fatigue, fever, night sweats, weight gain or weight loss Psychological ROS: negative for - behavioral disorder, hallucinations, memory difficulties, mood swings or suicidal ideation Ophthalmic ROS: negative for - blurry vision, double vision, eye pain or loss of vision ENT ROS: negative for - epistaxis, nasal discharge, oral lesions, sore throat, tinnitus or vertigo Allergy and Immunology ROS: negative for - hives or itchy/watery eyes Hematological and Lymphatic ROS: negative for - bleeding problems, bruising or swollen lymph nodes Endocrine ROS: negative for - galactorrhea, hair pattern changes, polydipsia/polyuria or temperature intolerance Respiratory ROS: negative for - cough, hemoptysis, shortness of breath or wheezing Cardiovascular ROS: negative for - chest pain, dyspnea on exertion, edema or irregular heartbeat Gastrointestinal ROS: negative for - abdominal pain, diarrhea, hematemesis, nausea/vomiting or stool incontinence Genito-Urinary ROS: negative for - dysuria, hematuria, incontinence or urinary frequency/urgency Musculoskeletal ROS: negative for - joint swelling or muscular weakness Neurological ROS: as noted in HPI Dermatological ROS: negative for rash and skin lesion changes   Blood pressure 97/43, pulse 67, temperature 97.6 F (36.4 C), temperature source Axillary, resp. rate 18, height 6'  (1.829 m), weight 154 kg (339 lb 8.1 oz), SpO2 95 %.   Neurologic Examination:                                                                                                      HEENT-  Normocephalic, no lesions, without obvious abnormality.  Normal external eye and conjunctiva.  Normal TM's bilaterally.  Normal auditory canals and external ears. Normal external nose, mucus membranes and septum.  Normal pharynx. Cardiovascular- regular rate and rhythm, S1, S2 normal, no murmur, click, rub or gallop, pulses palpable throughout   Lungs- chest clear, no wheezing, rales, normal symmetric air entry, Heart exam - S1, S2 normal, no murmur, no gallop, rate regular Abdomen- soft, non-tender; bowel sounds normal; no masses,  no organomegaly   Neurological Examination on propofol Mental Status: Does not open eyes, does not follow commands No corneals,  No cough or gag Does have VOR's Pupils are equal 3mm and briskly reactive Triple flexion in the LE's b/l  10 min after holding propofol, started to have myoclonic type jerking in  the R leg and foot along with abdomen. Was given 2mg  of ativan and responded.         Lab Results: Basic Metabolic Panel:  Recent Labs Lab 04-07-2015 1235 04-07-2015 1241 04-07-2015 1530 07/21/15 0045 07/21/15 0309 07/21/15 0900 07/22/15 0516  NA 143 143  --  142 140 140 140  K 3.6 3.5  --  3.5 3.6 3.6 4.2  CL 106 102  --  104 103 102 103  CO2 21*  --   --  25 25 25 25   GLUCOSE 289* 290*  --  197* 227* 220* 161*  BUN 29* 28*  --  32* 33* 34* 42*  CREATININE 1.69* 1.30*  --  2.15* 2.28* 2.48* 2.92*  CALCIUM 12.3*  --   --  10.8* 10.5* 10.0 9.0  MG 3.1*  --  2.4 2.2 2.5* 2.4  --   PHOS  --   --  5.8* 2.0*  --  4.6  --     Liver Function Tests:  Recent Labs Lab 04-07-2015 1235  AST 68*  ALT 53  ALKPHOS 51  BILITOT 0.4  PROT 6.2*  ALBUMIN 2.4*   No results for input(s): LIPASE, AMYLASE in the last 168 hours. No results for input(s): AMMONIA in the  last 168 hours.  CBC:  Recent Labs Lab 04-07-2015 1235 04-07-2015 1241 07/21/15 0340 07/22/15 0517  WBC 9.4  --  13.8* 13.4*  NEUTROABS 4.6  --   --   --   HGB 10.0* 11.6* 10.0* 9.2*  HCT 32.8* 34.0* 30.9* 28.8*  MCV 100.0  --  94.8 95.7  PLT 173  --  214 191    Cardiac Enzymes:  Recent Labs Lab 04-07-2015 1235 04-07-2015 1530 04-07-2015 2200 07/21/15 0340  TROPONINI 0.03 2.12* 4.02* 3.04*    Lipid Panel:  Recent Labs Lab 04-07-2015 1659 07/21/15 1836  TRIG 514* 131    CBG:  Recent Labs Lab 07/21/15 1613 07/21/15 2155 07/22/15 0033 07/22/15 0449 07/22/15 0801  GLUCAP 153* 176* 143* 154* 132*    Microbiology: Results for orders placed or performed during the hospital encounter of 04-07-2015  Urine culture     Status: None (Preliminary result)   Collection Time: 04-07-2015  3:40 PM  Result Value Ref Range Status   Specimen Description URINE, CATHETERIZED  Final   Special Requests NONE  Final   Culture NO GROWTH < 24 HOURS  Final   Report Status PENDING  Incomplete  MRSA PCR Screening     Status: None   Collection Time: 04-07-2015  3:50 PM  Result Value Ref Range Status   MRSA by PCR NEGATIVE NEGATIVE Final    Comment:        The GeneXpert MRSA Assay (FDA approved for NASAL specimens only), is one component of a comprehensive MRSA colonization surveillance program. It is not intended to diagnose MRSA infection nor to guide or monitor treatment for MRSA infections.   Culture, blood (Routine X 2) w Reflex to ID Panel     Status: None (Preliminary result)   Collection Time: 04-07-2015  4:40 PM  Result Value Ref Range Status   Specimen Description BLOOD BLOOD RIGHT ARM  Final   Special Requests IN PEDIATRIC BOTTLE .5CC  Final   Culture NO GROWTH < 24 HOURS  Final   Report Status PENDING  Incomplete  Culture, blood (Routine X 2) w Reflex to ID Panel     Status: None (Preliminary result)   Collection Time: 04-07-2015  6:17 PM  Result Value Ref Range Status    Specimen Description BLOOD RIGHT ANTECUBITAL  Final   Special Requests IN PEDIATRIC BOTTLE 3CC  Final   Culture NO GROWTH < 24 HOURS  Final   Report Status PENDING  Incomplete    Coagulation Studies:  Recent Labs  2015/08/17 1235  LABPROT 14.6  INR 1.12    Imaging: Ct Head Wo Contrast  07/21/2015  CLINICAL DATA:  70 year old male with history of cardiac arrest, down for 8-10 minutes. Abnormal EEG. EXAM: CT HEAD WITHOUT CONTRAST TECHNIQUE: Contiguous axial images were obtained from the base of the skull through the vertex without intravenous contrast. COMPARISON:  Head CT 05/13/2015. FINDINGS: No acute intracranial abnormalities. Specifically, no evidence of acute intracranial hemorrhage, no definite findings of acute/subacute cerebral ischemia, no mass, mass effect, hydrocephalus or abnormal intra or extra-axial fluid collections. Visualized paranasal sinuses and mastoids are well pneumatized, with exception of some small mucosal retention cysts or polyps in the maxillary sinuses bilaterally. No acute displaced skull fractures are identified. IMPRESSION: 1. No acute intracranial abnormalities. Electronically Signed   By: Trudie Reed M.D.   On: 07/21/2015 12:58   Dg Chest Port 1 View  07/22/2015  CLINICAL DATA:  Respiratory failure. EXAM: PORTABLE CHEST 1 VIEW COMPARISON:  07/21/2015. FINDINGS: Endotracheal tube and NG tube in stable position. Cardiomegaly with diffuse bilateral interstitial prominence and bilateral pleural effusions consistent with congestive heart failure. Prior cervical spine fusion . IMPRESSION: 1.  Lines and tubes in stable position. 2. Cardiomegaly with diffuse bilateral pulmonary interstitial prominence and pleural effusions again noted consistent with congestive heart failure. 3. Low lung volumes with basilar atelectasis . Electronically Signed   By: Maisie Fus  Register   On: 07/22/2015 07:31   Dg Chest Port 1 View  07/21/2015  CLINICAL DATA:  70 year old male with  history of acute respiratory failure. EXAM: PORTABLE CHEST 1 VIEW COMPARISON:  Chest x-ray 2015/08/17. FINDINGS: An endotracheal tube is in place with tip 3.3 cm above the carina. A nasogastric tube is seen extending into the stomach, however, the tip of the nasogastric tube extends below the lower margin of the image. Lung volumes are low. There are bibasilar opacities (left greater than right), which may reflect areas of atelectasis and/or airspace consolidation. Small left pleural effusion. Vascular crowding, accentuated by low lung volumes, without frank pulmonary edema. Mild cardiomegaly. Upper mediastinal contours are distorted by patient positioning, but appear similar to the prior study. Orthopedic fixation hardware in the lower cervical spine. Transcutaneous defibrillator pads projecting over the lower left hemithorax. IMPRESSION: 1. Support apparatus, as above. 2. Low lung volumes with probable bibasilar subsegmental atelectasis. 3. Mild cardiomegaly. Electronically Signed   By: Trudie Reed M.D.   On: 07/21/2015 09:27   Dg Chest Portable 1 View  08-17-2015  CLINICAL DATA:  CPR EXAM: PORTABLE CHEST 1 VIEW COMPARISON:  05/29/2015 FINDINGS: Endotracheal tube placed. Tip is 3.9 cm from the carina. Low lung volumes. Cardiomegaly. Lungs are grossly clear. IMPRESSION: Endotracheal tube is 3.9 cm from the carina Cardiomegaly and low lung volumes. Electronically Signed   By: Jolaine Click M.D.   On: 08/17/2015 13:15   Dg Abd Portable 1v  Aug 17, 2015  CLINICAL DATA:  Orogastric tube placement EXAM: PORTABLE ABDOMEN - 1 VIEW COMPARISON:  None. FINDINGS: Numerous devices overlie the left upper quadrant and it is very difficult to confidently identify the tip of the orogastric tube. The tip appears to cross the gastroesophageal junction with side hole projecting over gastric air bubble. IMPRESSION: Orogastric  tube difficult to visualize in its entirety but appears to project over the stomach. Electronically  Signed   By: Esperanza Heir M.D.   On: 06/24/2015 16:31       Assessment/Plan: 70 Y/O with PMH of hyperlipidemia, HTN, OSA, BPH, glaucoma, CHF and DM who lost consciousness as he was getting into [place] go [place] the [place]. Wife drove the pt to ED which took about 8 or more mins and CPR initiated there. Noted to be in PEA arrest with one episode of V fib on arrival. Resuscitated in ED with about 10 mins to ROSC. He then lost his pulse again and then coded for 5 more minutes. Intubated, CVC placed and on pressors. Transferred to [facility] for further eval. EEG showed burst suppression with myoclonic bursts. CTH was negative. No hypothermia was initiated.   - Would recommend MRI brain without contrast to better assess prognosis - Repeat EEG, may consider AED trail depending on what MRI and EEG shows  - Would ideally need to hold sedation to get best Neuro assessment, adding a AED may help with the movements and can potentially mean propofol can be stopped and a better neuro assessment can be done.    So will need MRI first, then hook up to continious EEG, will then based on the reading load with Depakote 20mg /kg and continue on 500mg  TID, will need depakote level 24hrs after loading

## 2015-07-22 NOTE — Consult Note (Signed)
WOC wound consult note Reason for Consult: [facility]  Wife at bedside reports surgery per Dr. Noel Geroldohen, has been followed as outpatient and surgeon is aware of surgical wound dehiscence.  Wound type: 3 open areas along lumbar surgical wound Measurement: distal: 0.3cm x 0.3cm x 3cm tunneling; medial: 0.5cm x 0.5cm x 5cm tunneling; proximal: 0.3cm x 0.2cm x 7cm tunneling.  Wound ZOX:WRUEAVbed:unable to truly visualize wound base but the medial and distal wounds do have some slough present Drainage (amount, consistency, odor) moderate, yellow with slight odor Periwound: intact  Dressing procedure/placement/frequency: Patient's wife has been using iodoform packing at home, will continue this while inpatient.  Will reeval drainage after a week and if not improved would recommend change to silver hydrofiber packing.  His cardiac arrest was significant, he is sedated and unresponsive on the vent.    WOC team will follow along with you for weekly wound assessments.  Please notify me of any acute changes in the wounds or any new areas of concerns Stephen PickupMelody Abbygayle Helfand RN,CWOCN [phone]

## 2015-07-22 NOTE — Progress Notes (Signed)
PULMONARY / CRITICAL CARE MEDICINE   Name: [name] MRN: [id number] DOB: 08-Sep-1945    ADMISSION DATE:  07-24-15 CONSULTATION DATE:  07/24/2015  REFERRING MD: [facility].   SUBJECTIVE: RN reports ongoing mild myoclonus noted of mouth, remains on propofol at 20 mcg/kg/min.  No purposeful movements noted per staff.   VITAL SIGNS: BP 114/58 mmHg  Pulse 74  Temp(Src) 97.6 F (36.4 C) (Axillary)  Resp 18  Ht 6' (1.829 m)  Wt 154 kg (339 lb 8.1 oz)  BMI 46.04 kg/m2  SpO2 100%  HEMODYNAMICS:    VENTILATOR SETTINGS: Vent Mode:  [-] PRVC FiO2 (%):  [30 %] 30 % Set Rate:  [18 bmp] 18 bmp Vt Set:  [550 mL] 550 mL PEEP:  [5 cmH20] 5 cmH20 Plateau Pressure:  [18 cmH20-27 cmH20] 26 cmH20  INTAKE / OUTPUT: I/O last 3 completed shifts: In: 3939.8 [I.V.:2529.8; IV Piggyback:1410] Out: 975 [Urine:975]  PHYSICAL EXAMINATION: General:  Obese male lying in bed, no acute distress Neuro:  Myoclonic jerks. No response to verbal stimuli HEENT:  PERRL, No JVD Cardiovascular:  s1s2 rrr, no m/r/g Lungs:  RRR, NO MRG Abdomen:  Obese, distended. Musculoskeletal:  No acute deformities  Skin:  Intact.  LABS:  BMET  Recent Labs Lab 07/21/15 0309 07/21/15 0900 07/22/15 0516  NA 140 140 140  K 3.6 3.6 4.2  CL 103 102 103  CO2 BUN 33* 34* 42*  CREATININE 2.28* 2.48* 2.92*  GLUCOSE 227* 220* 161*    Electrolytes  Recent Labs Lab 07/24/2015 1530 07/21/15 0045 07/21/15 0309 07/21/15 0900 07/22/15 0516  CALCIUM  --  10.8* 10.5* 10.0 9.0  MG 2.4 2.2 2.5* 2.4  --   PHOS 5.8* 2.0*  --  4.6  --     CBC  Recent Labs Lab 07/24/2015 1235 07/24/2015 1241 07/21/15 0340 07/22/15 0517  WBC 9.4  --  13.8* 13.4*  HGB 10.0* 11.6* 10.0* 9.2*  HCT 32.8* 34.0* 30.9* 28.8*  PLT 173  --  214 191    Coag's  Recent Labs Lab 07-24-15 1235  INR 1.12    Sepsis Markers  Recent Labs Lab 07/21/15 0309  LATICACIDVEN 2.7*    ABG  Recent Labs Lab  24-Jul-2015 1650 07/21/15 0410 07/21/15 0819  PHART 7.441 7.587* 7.469*  PCO2ART 38.3 24.4* 36.9  PO2ART 178.0* 104* 92.0    Liver Enzymes  Recent Labs Lab Jul 24, 2015 1235  AST 68*  ALT 53  ALKPHOS 51  BILITOT 0.4  ALBUMIN 2.4*    Cardiac Enzymes  Recent Labs Lab 2015/07/24 1530 07/24/2015 2200 07/21/15 0340  TROPONINI 2.12* 4.02* 3.04*    Glucose  Recent Labs Lab 07/21/15 1121 07/21/15 1613 07/21/15 2155 07/22/15 0033 07/22/15 0449 07/22/15 0801  GLUCAP 147* 153* 176* 143* 154* 132*    Imaging Ct Head Wo Contrast  07/21/2015  CLINICAL DATA:  70 year old male with history of cardiac arrest, down for 8-10 minutes. Abnormal EEG. EXAM: CT HEAD WITHOUT CONTRAST TECHNIQUE: Contiguous axial images were obtained from the base of the skull through the vertex without intravenous contrast. COMPARISON:  Head CT 05/13/2015. FINDINGS: No acute intracranial abnormalities. Specifically, no evidence of acute intracranial hemorrhage, no definite findings of acute/subacute cerebral ischemia, no mass, mass effect, hydrocephalus or abnormal intra or extra-axial fluid collections. Visualized paranasal sinuses and mastoids are well pneumatized, with exception of some small mucosal retention cysts or polyps in the maxillary sinuses bilaterally. No acute displaced skull fractures are identified. IMPRESSION:  1. No acute intracranial abnormalities. Electronically Signed   By: Trudie Reedaniel  Entrikin M.D.   On: 07/21/2015 12:58   Dg Chest Port 1 View  07/22/2015  CLINICAL DATA:  Respiratory failure. EXAM: PORTABLE CHEST 1 VIEW COMPARISON:  07/21/2015. FINDINGS: Endotracheal tube and NG tube in stable position. Cardiomegaly with diffuse bilateral interstitial prominence and bilateral pleural effusions consistent with congestive heart failure. Prior cervical spine fusion . IMPRESSION: 1.  Lines and tubes in stable position. 2. Cardiomegaly with diffuse bilateral pulmonary interstitial prominence and pleural  effusions again noted consistent with congestive heart failure. 3. Low lung volumes with basilar atelectasis . Electronically Signed   By: Maisie Fushomas  Register   On: 07/22/2015 07:31   STUDIES:  LE Doppler 4/29 >>  EEG 4/29 >> burst suppression with epileptiform appearing bursts  CULTURES: Bcx 4/29 >>  Ucx 4/29 >>  Sp Cx 4/29 >>  ANTIBIOTICS: Vanco 4/29 >>  Zosyn 4/29 >>   SIGNIFICANT EVENTS: 4/29  Admit after cardiac arrest   LINES/TUBES: ETT 4/29 >>   DISCUSSION: 70 Y/O with multiple medical co morbidities. Admitted after arrest at home. Found to be in PEA/Vfib. Rhythm on EKG concerning for complete heart block, LBBB concerning for acute coronary syndrome. He has myoclonus on examination with signs of anoxic brain injury. He went about 8 min without CPR.   ASSESSMENT / PLAN:  PULMONARY A: VDRF Recent surgery. Concern for PE P:   Continue vent support, PRVC 8 cc/kg. Wean PEEP / FiO2 for sats > 90%. Intermittent CXR, ABG. LE dopplers pending. Hold weaning given neuro status.  CARDIOVASCULAR A:  High grade AV block LBBB Concern for ACS P:  Cardiology consult appreciated, no cath given neuro status. ICU monitoring  DNR in the event of arrest   RENAL A:   AKI post arrest P:   Follow urine output and Cr Trend BMP  Replace electrolytes as indicated  KVO IVF.  GASTROINTESTINAL A:   Obesity  P:   Consult nutrition for TF as per nutrition.  HEMATOLOGIC A:   Anemia  P:  Monitor CBC Heparin gtt per pharmacy   INFECTIOUS A:   Recent back surgery with purulent discharge P:   Cover with vanco, zosyn Check cultures, procalcitonin Cultures as above  ENDOCRINE A:   DM P:   SSI  NEUROLOGIC A:   Concern for Anoxic injury Myoclonic jerks Not a hypothermia candidate due to prolonged time to ROSC EEG with burst suppression pattern. P:   Sedation for myoclonus  CT head >> WNL EEG as above Neuro consult called.  FAMILY  - Updates: Wife updated  bedside.  LCB with no CPR and no cardioversion.  - Inter-disciplinary family meet or Palliative Care meeting due by:  5/6  The patient is critically ill with multiple organ systems failure and requires high complexity decision making for assessment and support, frequent evaluation and titration of therapies, application of advanced monitoring technologies and extensive interpretation of multiple databases.   Critical Care Time devoted to patient care services described in this note is  35  Minutes. This time reflects time of care of this signee Dr Koren BoundWesam Yacoub. This critical care time does not reflect procedure time, or teaching time or supervisory time of PA/NP/Med student/Med Resident etc but could involve care discussion time.  Alyson ReedyWesam G. Yacoub, M.D. [facility]. Pager: [phone]. After hours pager: [phone].  07/22/2015, 10:13 AM

## 2015-07-22 DEATH — deceased

## 2015-07-23 ENCOUNTER — Inpatient Hospital Stay (HOSPITAL_COMMUNITY): Payer: Medicare HMO

## 2015-07-23 DIAGNOSIS — G931 Anoxic brain damage, not elsewhere classified: Secondary | ICD-10-CM

## 2015-07-23 DIAGNOSIS — I469 Cardiac arrest, cause unspecified: Secondary | ICD-10-CM

## 2015-07-23 LAB — BASIC METABOLIC PANEL
Anion gap: 12 (ref 5–15)
BUN: 48 mg/dL — AB (ref 6–20)
CALCIUM: 8.5 mg/dL — AB (ref 8.9–10.3)
CO2: 24 mmol/L (ref 22–32)
CREATININE: 2.94 mg/dL — AB (ref 0.61–1.24)
Chloride: 103 mmol/L (ref 101–111)
GFR calc non Af Amer: 20 mL/min — ABNORMAL LOW (ref >60)
GFR, EST AFRICAN AMERICAN: 23 mL/min — AB (ref >60)
GLUCOSE: 181 mg/dL — AB (ref 65–99)
Potassium: 4.2 mmol/L (ref 3.5–5.1)
Sodium: 139 mmol/L (ref 135–145)

## 2015-07-23 LAB — CBC
HCT: 25.7 % — ABNORMAL LOW (ref 39.0–52.0)
Hemoglobin: 8.1 g/dL — ABNORMAL LOW (ref 13.0–17.0)
MCH: 29.8 pg (ref 26.0–34.0)
MCHC: 31.5 g/dL (ref 30.0–36.0)
MCV: 94.5 fL (ref 78.0–100.0)
PLATELETS: 189 10*3/uL (ref 150–400)
RBC: 2.72 MIL/uL — ABNORMAL LOW (ref 4.22–5.81)
RDW: 15.5 % (ref 11.5–15.5)
WBC: 10.8 10*3/uL — ABNORMAL HIGH (ref 4.0–10.5)

## 2015-07-23 LAB — GLUCOSE, CAPILLARY
GLUCOSE-CAPILLARY: 139 mg/dL — AB (ref 65–99)
GLUCOSE-CAPILLARY: 141 mg/dL — AB (ref 65–99)
Glucose-Capillary: 171 mg/dL — ABNORMAL HIGH (ref 65–99)

## 2015-07-23 LAB — BLOOD GAS, ARTERIAL
ACID-BASE EXCESS: 0.2 mmol/L (ref 0.0–2.0)
Bicarbonate: 24.3 mEq/L — ABNORMAL HIGH (ref 20.0–24.0)
DRAWN BY: 244861
FIO2: 0.3
LHR: 18 {breaths}/min
MECHVT: 580 mL
O2 SAT: 97.2 %
PATIENT TEMPERATURE: 98.6
PCO2 ART: 38.8 mmHg (ref 35.0–45.0)
PEEP: 5 cmH2O
PH ART: 7.412 (ref 7.350–7.450)
PO2 ART: 98.7 mmHg (ref 80.0–100.0)
TCO2: 25.4 mmol/L (ref 0–100)

## 2015-07-23 LAB — ECHOCARDIOGRAM COMPLETE
HEIGHTINCHES: 72 in
Weight: 5442.72 oz

## 2015-07-23 LAB — HEPARIN LEVEL (UNFRACTIONATED): Heparin Unfractionated: 0.3 IU/mL (ref 0.30–0.70)

## 2015-07-23 LAB — MAGNESIUM: Magnesium: 2.4 mg/dL (ref 1.7–2.4)

## 2015-07-23 LAB — PHOSPHORUS: PHOSPHORUS: 5.7 mg/dL — AB (ref 2.5–4.6)

## 2015-07-23 MED ORDER — PERFLUTREN LIPID MICROSPHERE
INTRAVENOUS | Status: AC
  Administered 2015-07-23: 3 mL via INTRAVENOUS
  Filled 2015-07-23: qty 10

## 2015-07-23 MED ORDER — DEXTROSE 5 % IV SOLN
10.0000 mg/h | INTRAVENOUS | Status: DC
  Administered 2015-07-23: 5 mg/h via INTRAVENOUS
  Filled 2015-07-23: qty 10

## 2015-07-23 MED ORDER — MORPHINE BOLUS VIA INFUSION
5.0000 mg | INTRAVENOUS | Status: DC | PRN
  Filled 2015-07-23: qty 20

## 2015-07-23 MED ORDER — PROPOFOL 1000 MG/100ML IV EMUL
5.0000 ug/kg/min | INTRAVENOUS | Status: DC
  Administered 2015-07-23: 39.965 ug/kg/min via INTRAVENOUS
  Administered 2015-07-23: 40 ug/kg/min via INTRAVENOUS
  Filled 2015-07-23 (×2): qty 100

## 2015-07-23 MED ORDER — PERFLUTREN LIPID MICROSPHERE
1.0000 mL | INTRAVENOUS | Status: AC | PRN
  Administered 2015-07-23: 3 mL via INTRAVENOUS
  Filled 2015-07-23: qty 10

## 2015-07-24 LAB — CBG MONITORING, ED: GLUCOSE-CAPILLARY: 111 mg/dL — AB (ref 65–99)

## 2015-07-25 ENCOUNTER — Telehealth: Payer: Self-pay

## 2015-07-25 LAB — CULTURE, BLOOD (ROUTINE X 2)
CULTURE: NO GROWTH
Culture: NO GROWTH

## 2015-07-25 NOTE — Telephone Encounter (Signed)
On 07/25/2015 I [facility] [employer] (original). The death certificate is for cremation. The patient is a patient of Doctor Molli KnockYacoub. [facility] this pm for signature. On 07/26/2015 I received the death certificate back from Doctor Molli KnockYacoub. I got the death certificate ready and called the funeral home to let them know the death certificate is ready for pickup. I also faxed them a copy per their request.

## 2015-08-02 MED FILL — Medication: Qty: 1 | Status: AC

## 2015-08-22 NOTE — Progress Notes (Signed)
PULMONARY / CRITICAL CARE MEDICINE   Name: [name] MRN: [id number] DOB: 22-Oct-1945    ADMISSION DATE:  07/28/2015 CONSULTATION DATE:  28-Jul-2015  REFERRING MD: [facility].  SUBJECTIVE: No events overnight, remains unresponsive.   VITAL SIGNS: BP 115/53 mmHg  Pulse 82  Temp(Src) 98.3 F (36.8 C) (Oral)  Resp 23  Ht 6' (1.829 m)  Wt 154.3 kg (340 lb 2.7 oz)  BMI 46.13 kg/m2  SpO2 100%  HEMODYNAMICS:    VENTILATOR SETTINGS: Vent Mode:  [-] PRVC FiO2 (%):  [30 %] 30 % Set Rate:  [18 bmp] 18 bmp Vt Set:  [550 mL] 550 mL PEEP:  [5 cmH20] 5 cmH20 Plateau Pressure:  [20 cmH20-28 cmH20] 28 cmH20  INTAKE / OUTPUT: I/O last 3 completed shifts: In: 3490 [I.V.:1685; NG/GT:855; IV Piggyback:950] Out: 1620 [Urine:965; Emesis/NG output:655]  PHYSICAL EXAMINATION: General:  Obese male lying in bed, no acute distress Neuro:  Myoclonic jerks. No response to verbal stimuli HEENT:  PERRL, No JVD Cardiovascular:  s1s2 rrr, no m/r/g Lungs:  RRR, NO MRG Abdomen:  Obese, distended. Musculoskeletal:  No acute deformities  Skin:  Intact.  LABS:  BMET  Recent Labs Lab 07/21/15 0900 07/22/15 0516 08/02/2015 0433  NA 140 140 139  K 3.6 4.2 4.2  CL 102 103 103  CO2 25 25 24   BUN 34* 42* 48*  CREATININE 2.48* 2.92* 2.94*  GLUCOSE 220* 161* 181*    Electrolytes  Recent Labs Lab 07/21/15 0045 07/21/15 0309 07/21/15 0900 07/22/15 0516 08/12/2015 0433  CALCIUM 10.8* 10.5* 10.0 9.0 8.5*  MG 2.2 2.5* 2.4  --  2.4  PHOS 2.0*  --  4.6  --  5.7*    CBC  Recent Labs Lab 07/21/15 0340 07/22/15 0517 08/12/2015 0433  WBC 13.8* 13.4* 10.8*  HGB 10.0* 9.2* 8.1*  HCT 30.9* 28.8* 25.7*  PLT 214 191 189    Coag's  Recent Labs Lab 07/28/2015 1235  INR 1.12    Sepsis Markers  Recent Labs Lab 07/21/15 0309  LATICACIDVEN 2.7*    ABG  Recent Labs Lab 07/21/15 0410 07/21/15 0819 08/01/2015 0345  PHART 7.587* 7.469* 7.412  PCO2ART 24.4* 36.9 38.8   PO2ART 104* 92.0 98.7    Liver Enzymes  Recent Labs Lab 07/28/15 1235  AST 68*  ALT 53  ALKPHOS 51  BILITOT 0.4  ALBUMIN 2.4*    Cardiac Enzymes  Recent Labs Lab 07/28/15 1530 07/28/2015 2200 07/21/15 0340  TROPONINI 2.12* 4.02* 3.04*    Glucose  Recent Labs Lab 07/22/15 1130 07/22/15 1641 07/22/15 1934 07/22/15 2326 08/03/2015 0433 07/29/2015 0757  GLUCAP 130* 140* 124* 139* 171* 141*    Imaging Mr Brain Wo Contrast  07/22/2015  CLINICAL DATA:  70 year old male status post cardiopulmonary arrest with resuscitation. Suspected anoxic injury. Initial encounter. EXAM: MRI HEAD WITHOUT CONTRAST TECHNIQUE: Multiplanar, multiecho pulse sequences of the brain and surrounding structures were obtained without intravenous contrast. COMPARISON:  Head CT without contrast 07/21/2015. FINDINGS: Abnormal diffusion signal throughout the brain suggesting diffuse cortical and bilateral basal ganglia diffusion restriction. The appearance is symmetric, such that the abnormality is most apparent on ADC (series 400). The cerebral white matter, thalami, and brainstem appear spared. The degree of cerebellar involvement is unclear. Subsequently there is an exaggerated appearance of gray and white matter differentiation on T2 weighted imaging. There is no intracranial mass effect at this time. There are small number of chronic micro hemorrhages scattered in the brain with no definite acute  intracranial hemorrhage. Major intracranial vascular flow voids are preserved. No ventriculomegaly. Basilar cisterns remain patent. Negative pituitary and cervicomedullary junction. No superimposed cortical encephalomalacia identified. Intubated. It appears in oral enteric tube is looped within the oral cavity and pharynx (series 5, image 2). Trace mastoid fluid. Mild paranasal sinus mucosal thickening. Negative orbit soft tissues. Subcutaneous scalp edema. Negative visualized cervical spine. IMPRESSION: 1. Symmetric but  abnormal diffusion and T2 weighted imaging suspected on this exam, and highly suspicious for diffuse anoxic cerebral injury in this setting. The cerebral white matter, thalami, and brainstem appear relatively spared. 2. No associated intracranial mass effect at this time. No definite acute intracranial hemorrhage. 3. The oral enteric tube appears to be looped in the oral cavity and pharynx. Consider repositioning. Electronically Signed   By: Odessa FlemingH  Hall M.D.   On: 07/22/2015 19:04   Dg Chest Port 1 View  08/15/2015  CLINICAL DATA:  Shortness of breath. EXAM: PORTABLE CHEST 1 VIEW COMPARISON:  07/22/2015. FINDINGS: Endotracheal tube and NG tube in stable position. Cardiomegaly with bilateral pulmonary pulmonary infiltrates most consistent pulmonary edema. Similar findings on prior exam. Persistent bilateral pleural effusions without interim change. Low lung volumes with basilar atelectasis. No pneumothorax. Prior cervical spine fusion . IMPRESSION: 1. Lines and tubes in stable position. 2. Congestive heart failure with persistent bilateral pulmonary edema and bilateral pleural effusions. No interim change from prior exam . 3. Low lung volumes with basilar atelectasis. Electronically Signed   By: Maisie Fushomas  Register   On: 2015/04/22 07:15   STUDIES:  LE Doppler 4/29 >>  EEG 4/29 >> burst suppression with epileptiform appearing bursts  CULTURES: Bcx 4/29 >>  Ucx 4/29 >>  Sp Cx 4/29 >>  ANTIBIOTICS: Vanco 4/29 >>  Zosyn 4/29 >>   SIGNIFICANT EVENTS: 4/29  Admit after cardiac arrest   LINES/TUBES: ETT 4/29 >>   DISCUSSION: 70 Y/O with multiple medical co morbidities. Admitted after arrest at home. Found to be in PEA/Vfib. Rhythm on EKG concerning for complete heart block, LBBB concerning for acute coronary syndrome. He has myoclonus on examination with signs of anoxic brain injury. He went about 8 min without CPR.   ASSESSMENT / PLAN:  PULMONARY A: VDRF Recent surgery. Concern for PE P:    Terminal extubation today.  CARDIOVASCULAR A:  High grade AV block LBBB Concern for ACS P:  Full DNR.  RENAL A:   AKI post arrest P:   D/C further blood draws.  GASTROINTESTINAL A:   Obesity  P:   D/C TF.  HEMATOLOGIC A:   Anemia  P:  D/C further blood draws.  INFECTIOUS A:   Recent back surgery with purulent discharge P:   D/C abx.  ENDOCRINE A:   DM P:   D/C CBGs and ISS.  NEUROLOGIC A:   Concern for Anoxic injury Myoclonic jerks Not a hypothermia candidate due to prolonged time to ROSC EEG with burst suppression pattern. MRI have severe brain damage. P:   Propofol drip to control seizure. Morphine for pain.  FAMILY  - Updates: Spoke with family extensively, MRI with severe injury.  After discussion, decision made to make patient a full DNR with withdrawal when family is ready.  - Inter-disciplinary family meet or Palliative Care meeting due by:  5/6  The patient is critically ill with multiple organ systems failure and requires high complexity decision making for assessment and support, frequent evaluation and titration of therapies, application of advanced monitoring technologies and extensive interpretation of multiple databases.  Critical Care Time devoted to patient care services described in this note is  35  Minutes. This time reflects time of care of this signee Dr Koren Bound. This critical care time does not reflect procedure time, or teaching time or supervisory time of PA/NP/Med student/Med Resident etc but could involve care discussion time.  Alyson Reedy, M.D. [facility] Pulmonary/Critical Care Medicine. Pager: [phone]. After hours pager: [phone].  2015-08-14, 11:06 AM

## 2015-08-22 NOTE — Progress Notes (Signed)
   Spoke to family and note that support is being withdrawn.  We will sign off.

## 2015-08-22 NOTE — Discharge Summary (Addendum)
NAMDelman Kitten:  [name], Stephen               ACCOUNT NO.:  [id number]  MEDICAL RECORD NO.:  [id number]  LOCATION:  2H06C                        FACILITY:  [facility]  PHYSICIAN:  Felipa EvenerWesam Jake Yacoub, MD  DATE OF BIRTH:  [birth year]  DATE OF ADMISSION:  04-07-2015 DATE OF DISCHARGE:  08/10/2015                              DISCHARGE SUMMARY   DEATH SUMMARY  PRIMARY DIAGNOSES/CAUSE OF DEATH:  Pulseless electrical activity, cardiac arrest.  SECONDARY DIAGNOSES:  Ventricular fibrillation cardiac arrest likely due to acute MI, cardiogenic shock, high-grade left bundle-branch block likely due to acute NSTEMI, acute respiratory failure with hypoxemia, acute kidney injury, obesity, anemia, recent back surgery with purulent discharge likely surgical wound infection, diabetes mellitus, diffuse anoxic brain injury, and seizure activity.  The patient is a 70 year old male with past medical history significant for diabetes and morbid obesity, recently underwent back surgery, was found down unresponsive, [facility] in pulseless electrical activity, cardiac arrest.  During this hospitalization, turned to ventricular fibrillation.  ACLS protocol was followed, and the patient had to return to spontaneous circulation, was brought to the ER.  Underwent hypothermia protocol, was evaluated by Cardiology, head CAT scan done given his status epilepticus and was normal. The patient continues to have significant seizure activity.  Neurology was involved, EEG confirmed seizure activity, and the patient was taken to the MRI.  Post MRI, there was evidence of diffuse anoxic injury. Neurology as well as myself spoke with the family, informed that the chances of complete recovery is not existent, after which point, decided the patient would not want that level of care and requested comfort measure.  Propofol for control of seizure as well as morphine for pain were administered.  The patient was made a  complete DNR, and the patient was extubated and expired shortly thereafter with family at bedside.     Felipa EvenerWesam Jake Yacoub, MD     WJY/MEDQ  D:  08/10/2015  T:  08/10/2015  Job:  [id number]

## 2015-08-22 NOTE — Progress Notes (Signed)
Patient removed from ventilator and extubated with the withdrawal of life-sustaining protocol. RN at bedside. Patient comfortable at this time.

## 2015-08-22 NOTE — Progress Notes (Signed)
Chaplain presented to the patient's room to provide spiritual care support for the family, as the patient is  EOL transition. The wife and several other family members were present at the bedside, and report that the patient will be remove from the vent once their pastor arrives.  The family was reassured that they are also being support in prayers by the Chaplain's and the Spiritual Care Department.  They were also offered encouragement and prayer of peace. Chaplain support will continue as needed. Chaplain Janell QuietAudrey Areonna Bran [phone]

## 2015-08-22 NOTE — Progress Notes (Signed)
Echocardiogram 2D Echocardiogram has been performed.  [name], [name] 07/22/2015, 9:14 AM

## 2015-08-22 NOTE — Progress Notes (Signed)
ANTICOAGULATION CONSULT NOTE  Pharmacy Consult for Heparin  Indication: chest pain/ACS  Allergies  Allergen Reactions  . Codeine Other (See Comments)    Hallucinations/ paranoia    Patient Measurements: Height: 6' (182.9 cm) Weight: (!) 340 lb 2.7 oz (154.3 kg) IBW/kg (Calculated) : 77.6  Vital Signs: Temp: 98.3 F (36.8 C) (05/02 0400) Temp Source: Oral (05/02 0400) BP: 115/53 mmHg (05/02 0700) Pulse Rate: 80 (05/02 0700)  Labs:  Recent Labs  06/26/2015 1235  06/26/2015 1530 07/09/2015 2200  07/21/15 0340 07/21/15 0900  07/22/15 0516 07/22/15 0517 07/22/15 1220 2015-07-31 0433 2015-07-31 0437  HGB 10.0*  < >  --   --   --  10.0*  --   --   --  9.2*  --  8.1*  --   HCT 32.8*  < >  --   --   --  30.9*  --   --   --  28.8*  --  25.7*  --   PLT 173  --   --   --   --  214  --   --   --  191  --  189  --   LABPROT 14.6  --   --   --   --   --   --   --   --   --   --   --   --   INR 1.12  --   --   --   --   --   --   --   --   --   --   --   --   HEPARINUNFRC  --   --   --   --   --   --   --   < > 0.54  --  0.63  --  0.30  CREATININE 1.69*  < >  --   --   < >  --  2.48*  --  2.92*  --   --  2.94*  --   TROPONINI 0.03  --  2.12* 4.02*  --  3.04*  --   --   --   --   --   --   --   < > = values in this interval not displayed.  Estimated Creatinine Clearance: 34.8 mL/min (by C-G formula based on Cr of 2.94).  Assessment: 70 y.o. male  with ACS s/p cardiac arrest for heparin. HL on the low end of therapeutic this AM. Hgb down to 8.1, ptls wnl, no bleeding noted. New DVT in LLE noted yesterday.  Goal of Therapy:  Heparin level 0.3-0.7 units/ml Monitor platelets by anticoagulation protocol: Yes   Plan:  Increase heparin slightly to 1300 units/hr  Daily HL, CBC  Greggory Stallionristy Reyes, PharmD Clinical Pharmacy Resident Pager # [phone] 07/30/2015 7:53 AM

## 2015-08-22 NOTE — Progress Notes (Signed)
Subjective: No acute events overnight. MRI completed   Objective: Current vital signs: BP 115/53 mmHg  Pulse 82  Temp(Src) 98.3 F (36.8 C) (Oral)  Resp 23  Ht 6' (1.829 m)  Wt 154.3 kg (340 lb 2.7 oz)  BMI 46.13 kg/m2  SpO2 100% Vital signs in last 24 hours: Temp:  [97.3 F (36.3 C)-98.9 F (37.2 C)] 98.3 F (36.8 C) (05/02 0400) Pulse Rate:  [63-82] 82 (05/02 0845) Resp:  [18-24] 23 (05/02 0845) BP: (95-119)/(41-68) 115/53 mmHg (05/02 0700) SpO2:  [98 %-100 %] 100 % (05/02 0845) FiO2 (%):  [30 %] 30 % (05/02 0845) Weight:  [154.3 kg (340 lb 2.7 oz)] 154.3 kg (340 lb 2.7 oz) (05/02 0400)  Intake/Output from previous day: 05/01 0701 - 05/02 0700 In: 2545.9 [I.V.:1040.9; NG/GT:855; IV Piggyback:650] Out: 1170 [Urine:515; Emesis/NG output:655] Intake/Output this shift:   Nutritional status:    Neurologic Exam: Mental Status: Does not open eyes, does not follow commands No corneals,  No cough or gag Does have VOR's Pupils are equal 3mm and briskly reactive Triple flexion in the LE's b/l  Lab Results: Basic Metabolic Panel:  Recent Labs Lab 07/05/2015 1530 07/21/15 0045 07/21/15 0309 07/21/15 0900 07/22/15 0516 07-31-15 0433  NA  --  142 140 140 140 139  K  --  3.5 3.6 3.6 4.2 4.2  CL  --  104 103 102 103 103  CO2  --  GLUCOSE  --  197* 227* 220* 161* 181*  BUN  --  32* 33* 34* 42* 48*  CREATININE  --  2.15* 2.28* 2.48* 2.92* 2.94*  CALCIUM  --  10.8* 10.5* 10.0 9.0 8.5*  MG 2.4 2.2 2.5* 2.4  --  2.4  PHOS 5.8* 2.0*  --  4.6  --  5.7*    Liver Function Tests:  Recent Labs Lab 06/29/2015 1235  AST 68*  ALT 53  ALKPHOS 51  BILITOT 0.4  PROT 6.2*  ALBUMIN 2.4*   No results for input(s): LIPASE, AMYLASE in the last 168 hours. No results for input(s): AMMONIA in the last 168 hours.  CBC:  Recent Labs Lab 07/02/2015 1235 07/12/2015 1241 07/21/15 0340 07/22/15 0517 07/31/2015 0433  WBC 9.4  --  13.8* 13.4* 10.8*  NEUTROABS 4.6  --    --   --   --   HGB 10.0* 11.6* 10.0* 9.2* 8.1*  HCT 32.8* 34.0* 30.9* 28.8* 25.7*  MCV 100.0  --  94.8 95.7 94.5  PLT 173  --  214 191 189    Cardiac Enzymes:  Recent Labs Lab 07/19/2015 1235 07/16/2015 1530 07/19/2015 2200 07/21/15 0340  TROPONINI 0.03 2.12* 4.02* 3.04*    Lipid Panel:  Recent Labs Lab 07/16/2015 1659 07/21/15 1836  TRIG 514* 131    CBG:  Recent Labs Lab 07/22/15 1641 07/22/15 1934 07/22/15 2326 07/31/15 0433 2015-07-31 0757  GLUCAP 140* 124* 139* 171* 141*    Microbiology: Results for orders placed or performed during the hospital encounter of 07/13/2015  Urine culture     Status: None   Collection Time: 06/28/2015  3:40 PM  Result Value Ref Range Status   Specimen Description URINE, CATHETERIZED  Final   Special Requests NONE  Final   Culture NO GROWTH 2 DAYS  Final   Report Status 07/22/2015 FINAL  Final  MRSA PCR Screening     Status: None   Collection Time: 07/17/2015  3:50 PM  Result Value Ref Range Status  MRSA by PCR NEGATIVE NEGATIVE Final    Comment:        The GeneXpert MRSA Assay (FDA approved for NASAL specimens only), is one component of a comprehensive MRSA colonization surveillance program. It is not intended to diagnose MRSA infection nor to guide or monitor treatment for MRSA infections.   Culture, blood (Routine X 2) w Reflex to ID Panel     Status: None (Preliminary result)   Collection Time: 07/17/2015  4:40 PM  Result Value Ref Range Status   Specimen Description BLOOD BLOOD RIGHT ARM  Final   Special Requests IN PEDIATRIC BOTTLE .5CC  Final   Culture NO GROWTH 2 DAYS  Final   Report Status PENDING  Incomplete  Culture, blood (Routine X 2) w Reflex to ID Panel     Status: None (Preliminary result)   Collection Time: 07/19/2015  6:17 PM  Result Value Ref Range Status   Specimen Description BLOOD RIGHT ANTECUBITAL  Final   Special Requests IN PEDIATRIC BOTTLE 3CC  Final   Culture NO GROWTH 2 DAYS  Final   Report Status  PENDING  Incomplete    Coagulation Studies:  Recent Labs  07/13/2015 1235  LABPROT 14.6  INR 1.12    Imaging: Ct Head Wo Contrast  07/21/2015  CLINICAL DATA:  70 year old male with history of cardiac arrest, down for 8-10 minutes. Abnormal EEG. EXAM: CT HEAD WITHOUT CONTRAST TECHNIQUE: Contiguous axial images were obtained from the base of the skull through the vertex without intravenous contrast. COMPARISON:  Head CT 05/13/2015. FINDINGS: No acute intracranial abnormalities. Specifically, no evidence of acute intracranial hemorrhage, no definite findings of acute/subacute cerebral ischemia, no mass, mass effect, hydrocephalus or abnormal intra or extra-axial fluid collections. Visualized paranasal sinuses and mastoids are well pneumatized, with exception of some small mucosal retention cysts or polyps in the maxillary sinuses bilaterally. No acute displaced skull fractures are identified. IMPRESSION: 1. No acute intracranial abnormalities. Electronically Signed   By: Trudie Reed M.D.   On: 07/21/2015 12:58   Mr Brain Wo Contrast  07/22/2015  CLINICAL DATA:  70 year old male status post cardiopulmonary arrest with resuscitation. Suspected anoxic injury. Initial encounter. EXAM: MRI HEAD WITHOUT CONTRAST TECHNIQUE: Multiplanar, multiecho pulse sequences of the brain and surrounding structures were obtained without intravenous contrast. COMPARISON:  Head CT without contrast 07/21/2015. FINDINGS: Abnormal diffusion signal throughout the brain suggesting diffuse cortical and bilateral basal ganglia diffusion restriction. The appearance is symmetric, such that the abnormality is most apparent on ADC (series 400). The cerebral white matter, thalami, and brainstem appear spared. The degree of cerebellar involvement is unclear. Subsequently there is an exaggerated appearance of gray and white matter differentiation on T2 weighted imaging. There is no intracranial mass effect at this time. There are small  number of chronic micro hemorrhages scattered in the brain with no definite acute intracranial hemorrhage. Major intracranial vascular flow voids are preserved. No ventriculomegaly. Basilar cisterns remain patent. Negative pituitary and cervicomedullary junction. No superimposed cortical encephalomalacia identified. Intubated. It appears in oral enteric tube is looped within the oral cavity and pharynx (series 5, image 2). Trace mastoid fluid. Mild paranasal sinus mucosal thickening. Negative orbit soft tissues. Subcutaneous scalp edema. Negative visualized cervical spine. IMPRESSION: 1. Symmetric but abnormal diffusion and T2 weighted imaging suspected on this exam, and highly suspicious for diffuse anoxic cerebral injury in this setting. The cerebral white matter, thalami, and brainstem appear relatively spared. 2. No associated intracranial mass effect at this time. No definite acute intracranial hemorrhage.  3. The oral enteric tube appears to be looped in the oral cavity and pharynx. Consider repositioning. Electronically Signed   By: Odessa FlemingH  Hall M.D.   On: 07/22/2015 19:04   Dg Chest Port 1 View  08/19/2015  CLINICAL DATA:  Shortness of breath. EXAM: PORTABLE CHEST 1 VIEW COMPARISON:  07/22/2015. FINDINGS: Endotracheal tube and NG tube in stable position. Cardiomegaly with bilateral pulmonary pulmonary infiltrates most consistent pulmonary edema. Similar findings on prior exam. Persistent bilateral pleural effusions without interim change. Low lung volumes with basilar atelectasis. No pneumothorax. Prior cervical spine fusion . IMPRESSION: 1. Lines and tubes in stable position. 2. Congestive heart failure with persistent bilateral pulmonary edema and bilateral pleural effusions. No interim change from prior exam . 3. Low lung volumes with basilar atelectasis. Electronically Signed   By: Maisie Fushomas  Register   On: 08/13/2015 07:15   Dg Chest Port 1 View  07/22/2015  CLINICAL DATA:  Respiratory failure. EXAM:  PORTABLE CHEST 1 VIEW COMPARISON:  07/21/2015. FINDINGS: Endotracheal tube and NG tube in stable position. Cardiomegaly with diffuse bilateral interstitial prominence and bilateral pleural effusions consistent with congestive heart failure. Prior cervical spine fusion . IMPRESSION: 1.  Lines and tubes in stable position. 2. Cardiomegaly with diffuse bilateral pulmonary interstitial prominence and pleural effusions again noted consistent with congestive heart failure. 3. Low lung volumes with basilar atelectasis . Electronically Signed   By: Maisie Fushomas  Register   On: 07/22/2015 07:31    Medications: I have reviewed the patient's current medications.  Assessment/Plan:  70 Y/O with PMH of hyperlipidemia, HTN, OSA, BPH, glaucoma, CHF and DM who lost consciousness as he was getting into the [place] to go [place] the [place]. Wife drove the pt to ED which took about 8 or more mins and CPR initiated there. Noted to be in PEA arrest with one episode of V fib on arrival. Resuscitated in ED with about 10 mins to ROSC. He then lost his pulse again and then coded for 5 more minutes. Intubated, CVC placed and on pressors. [facility]. EEG showed burst suppression with myoclonic bursts. CTH was negative. No hypothermia was initiated.   - MRI results discussed with family at length. It is consistent with diffuse anoxic injury, along with his poor EEG and exam there is a poor prognosis for meaningful recovery. Family decided to go the pallative care route.

## 2015-08-22 DEATH — deceased

## 2016-09-14 IMAGING — DX DG LUMBAR SPINE COMPLETE 4+V
5 series · 5 of 5 positions shown · non-contrast
Comparison: Lumbar spine MRI 04/12/2011

CLINICAL DATA: Fell down 5 steps tonight landing on back. Now with
lumbosacral back pain radiating to the right.

EXAM:
LUMBAR SPINE - COMPLETE 4+ VIEW

[l-spine ap]
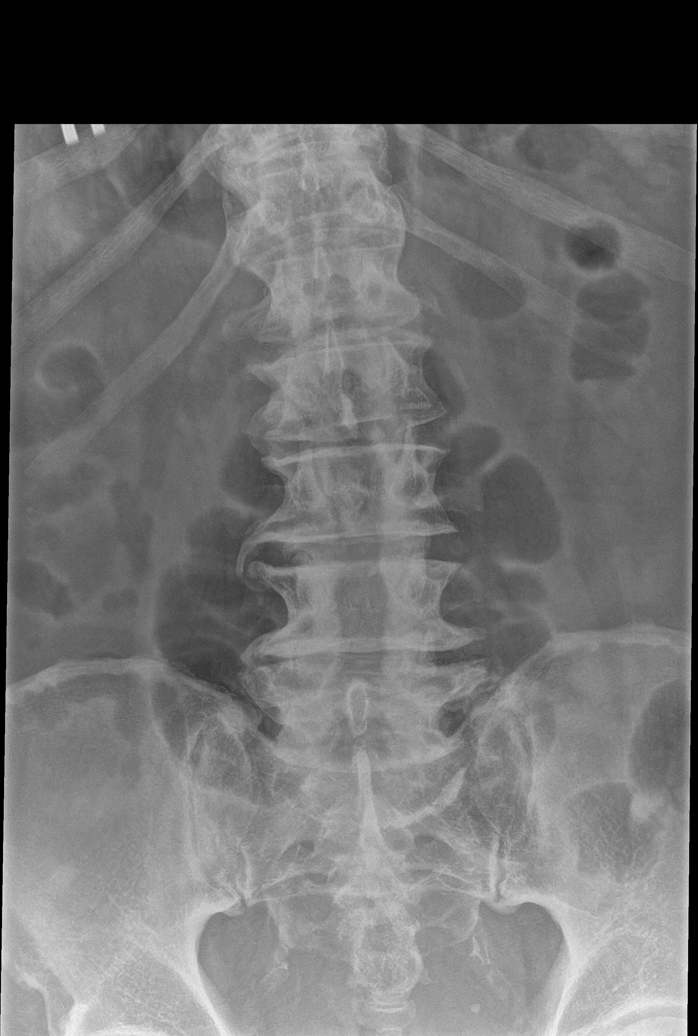

[l-spine obl (1 of 2)]
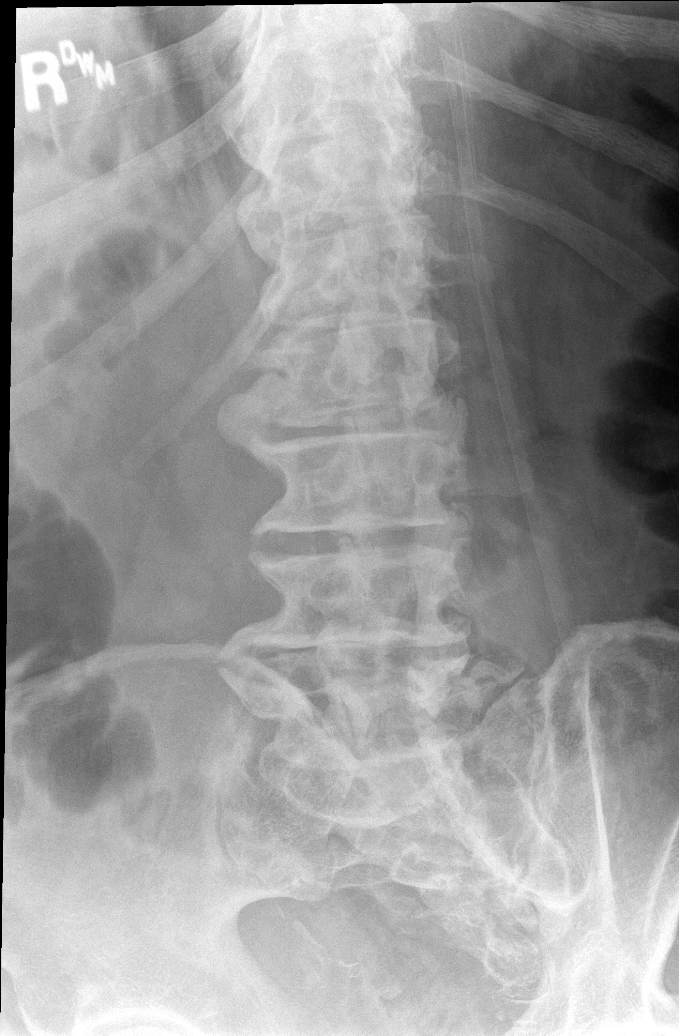

[l-spine obl (2 of 2)]
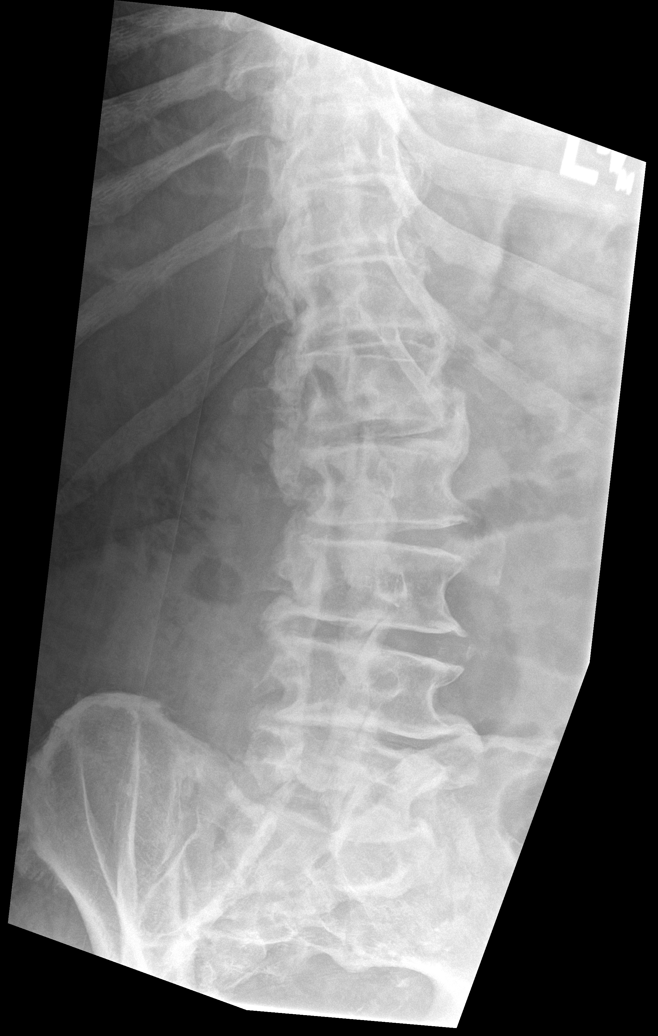

[l-spine lat]
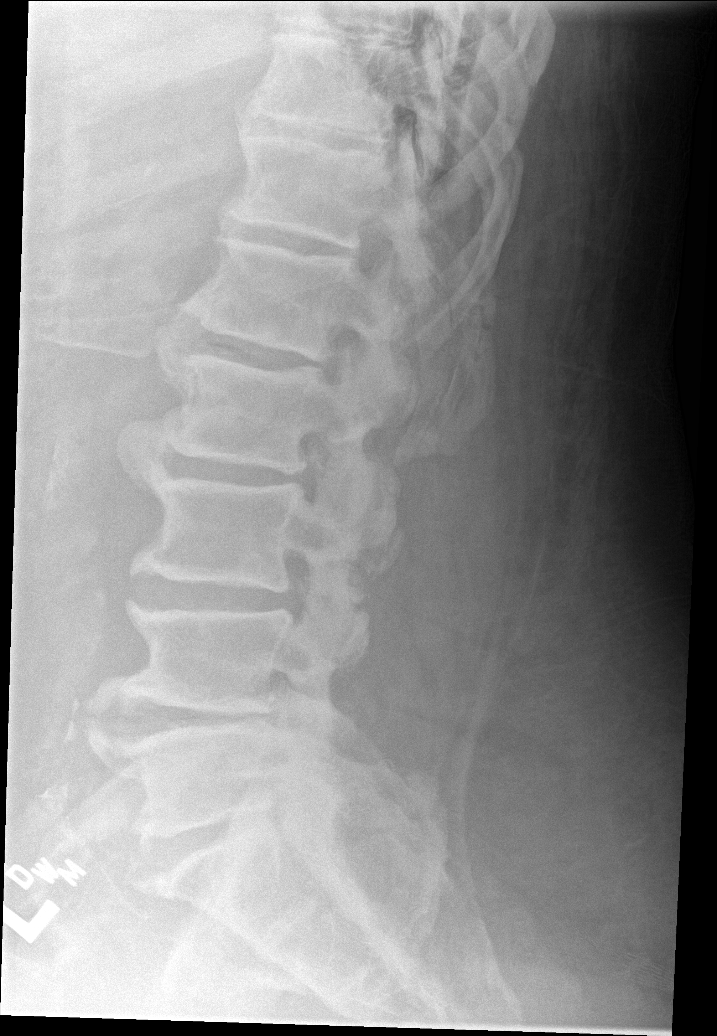

[l-spine spot]
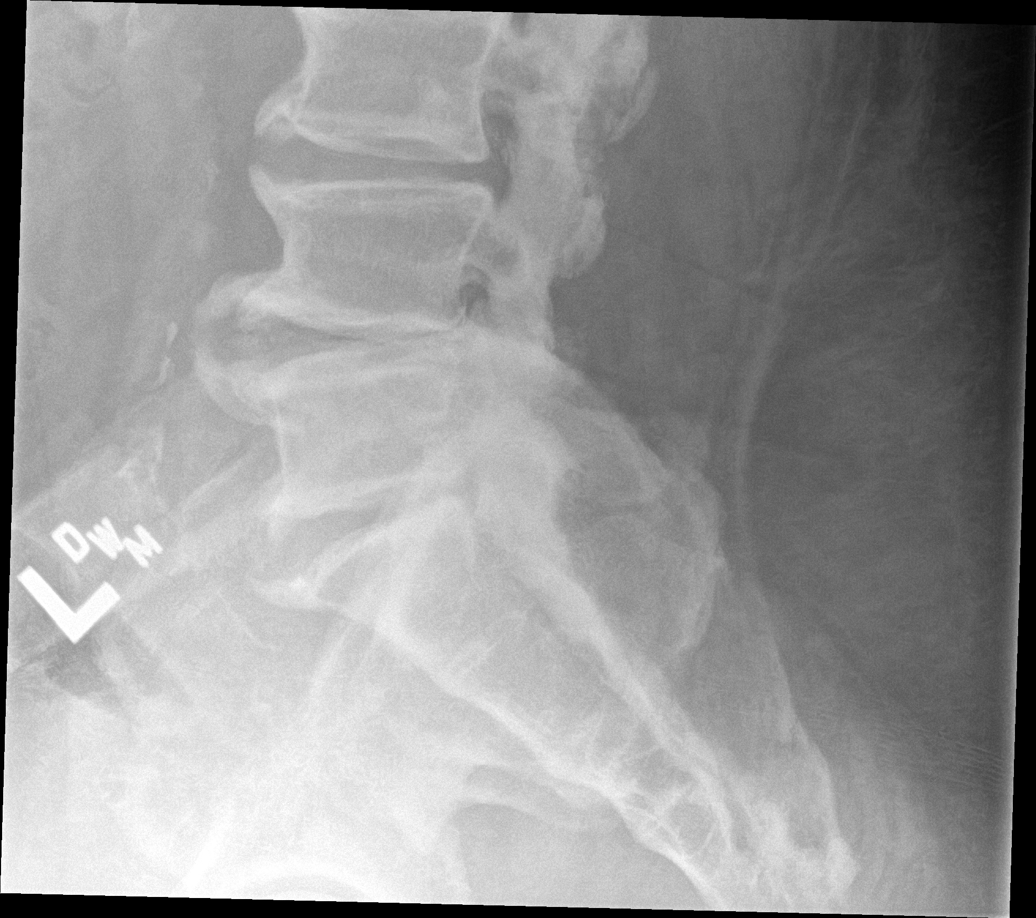

[5 of 5 positions shown; findings below may reference images not displayed]

FINDINGS: No acute fracture or subluxation. Vertebral body heights are
maintained. Large flowing anterior osteophytes multilevel facet
arthropathy. Posterior elements are intact. Sacroiliac joints remain
congruent.
IMPRESSION: Degenerative change in the lumbar spine without acute fracture or
subluxation.

## 2016-09-14 IMAGING — CT CT HEAD W/O CM
1 series · 16 of 30 positions shown, 20 images · non-contrast
Comparison: Prior study from 01/14/2015.

CLINICAL DATA: Initial evaluation for acute trauma, fall.

EXAM:
CT HEAD WITHOUT CONTRAST
TECHNIQUE: Contiguous axial images were obtained from the base of the skull
through the vertex without intravenous contrast.

[Series 2: head wo · axial · 0.48mm/px · z∈[+943,+1088]mm · 16 of 33 slices shown, 20 images]
[im 2/33  brain]
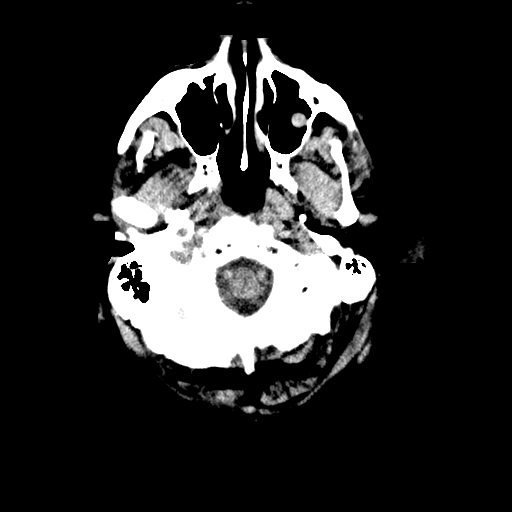
[im 2/33  bone]
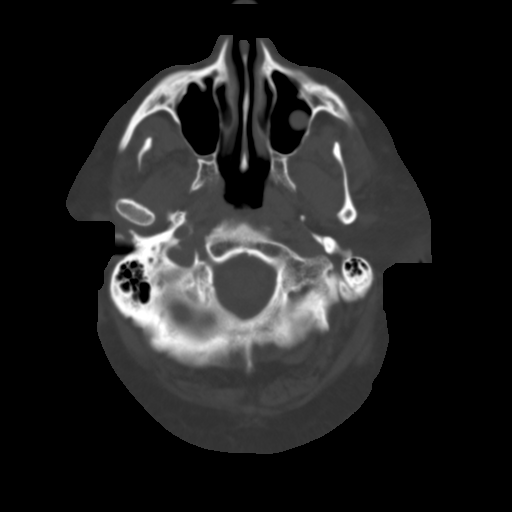
[im 4/33  brain]
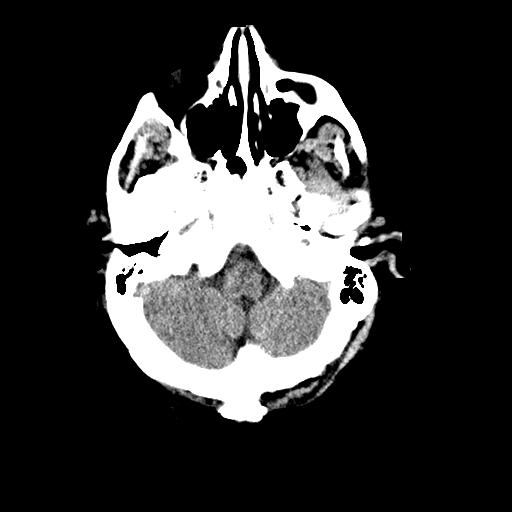
[im 6/33  brain]
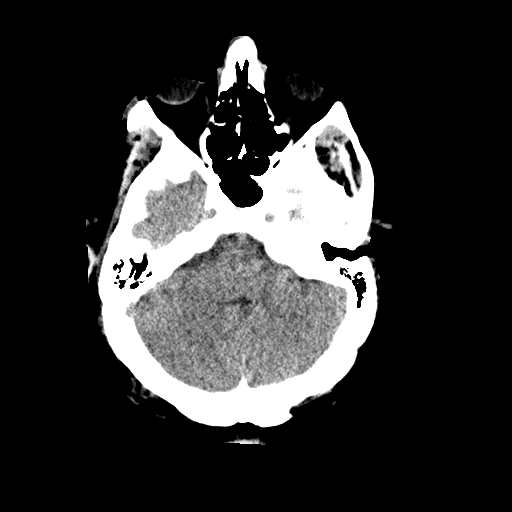
[im 8/33  brain]
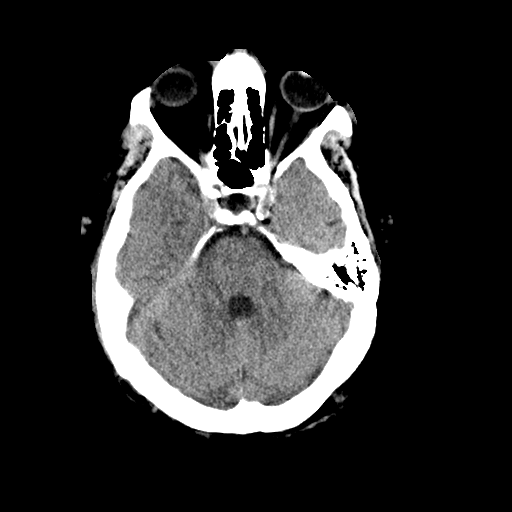
[im 9/33  brain]
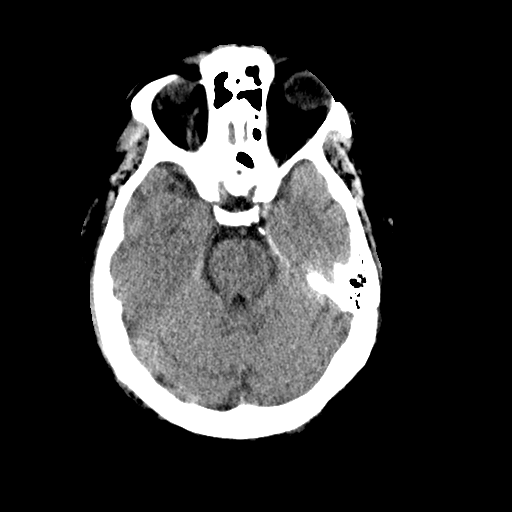
[im 9/33  bone]
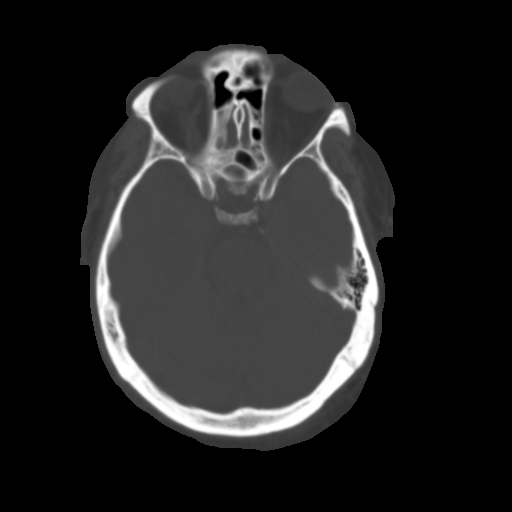
[im 12/33  brain]
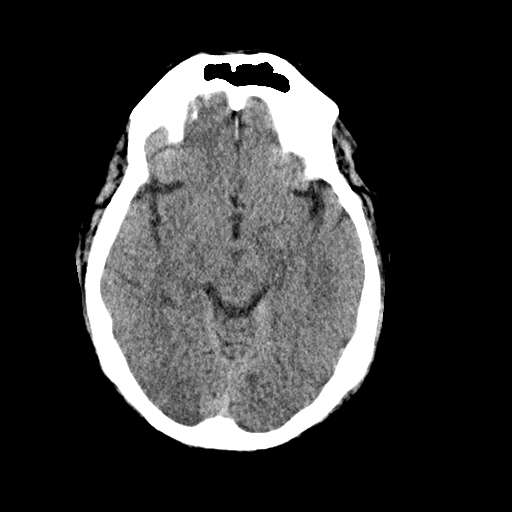
[im 14/33  brain]
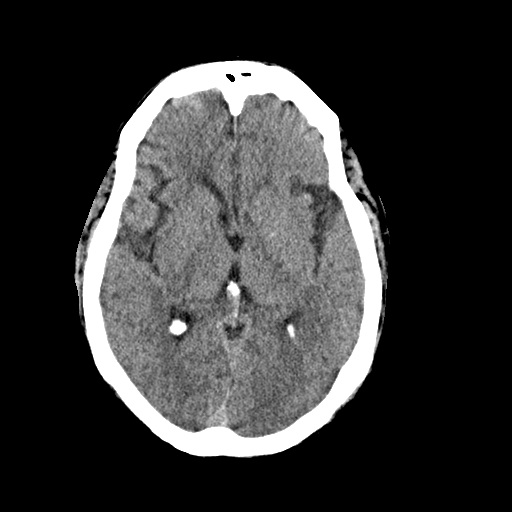
[im 16/33  brain]
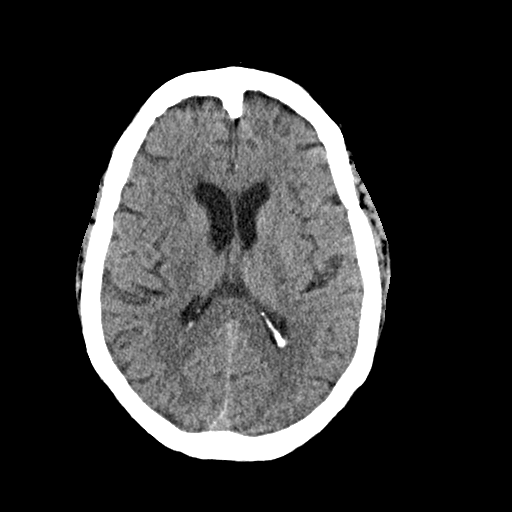
[im 17/33  brain]
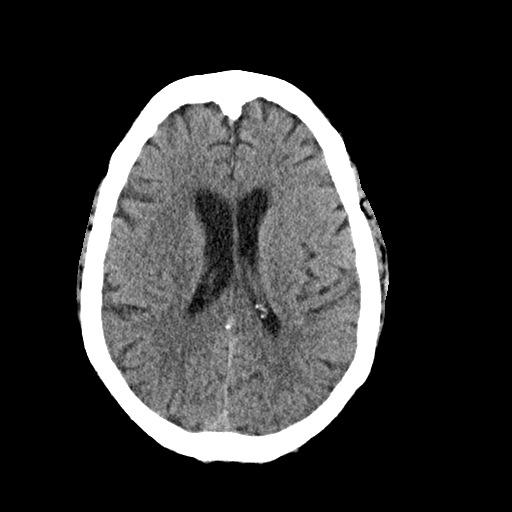
[im 17/33  bone]
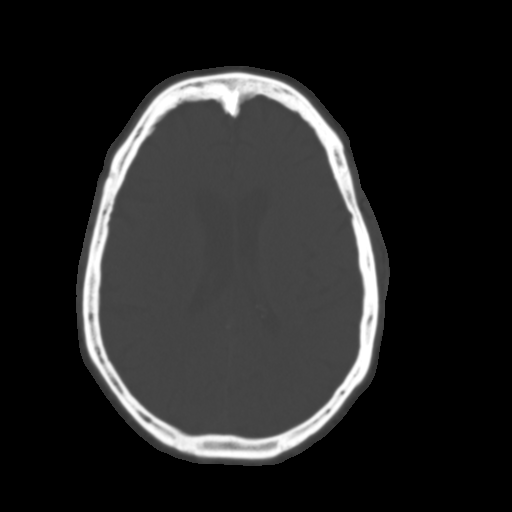
[im 19/33  brain]
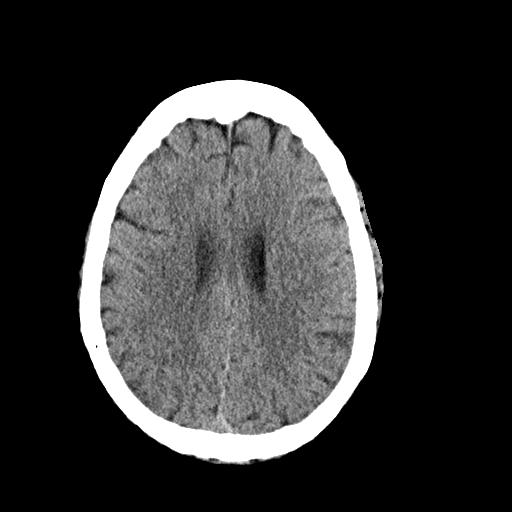
[im 21/33  brain]
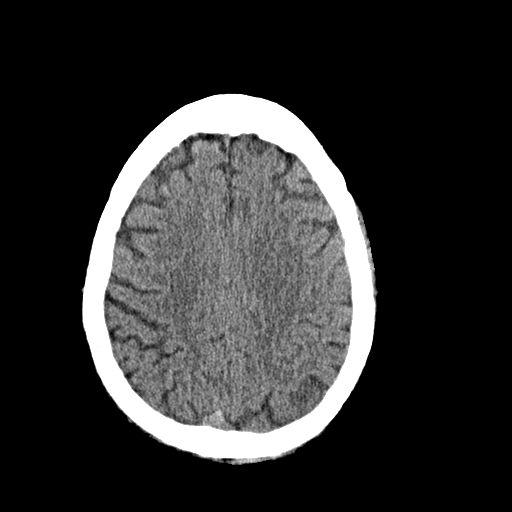
[im 24/33  brain]
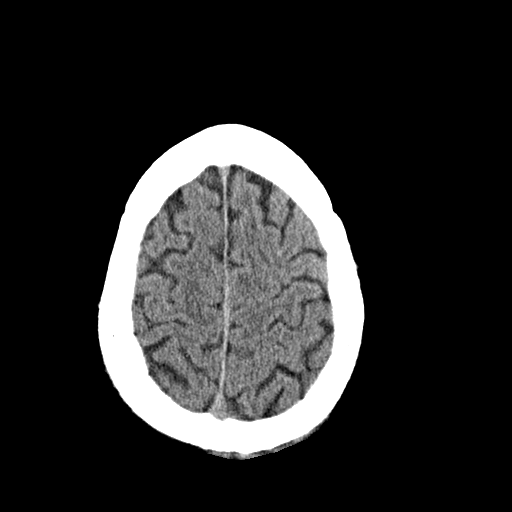
[im 25/33  brain]
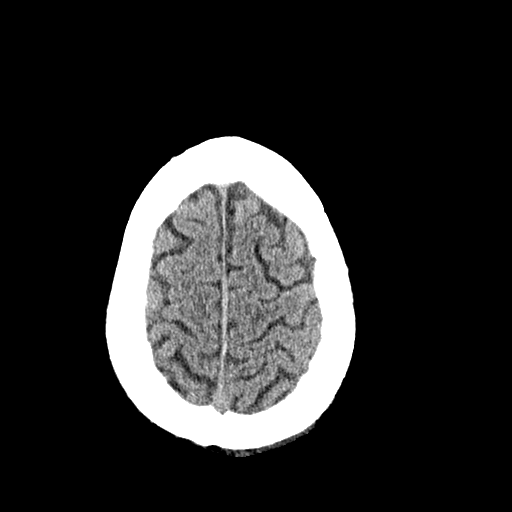
[im 25/33  bone]
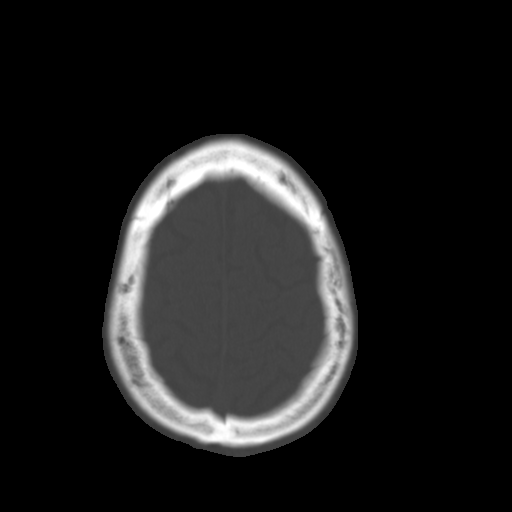
[im 27/33  brain]
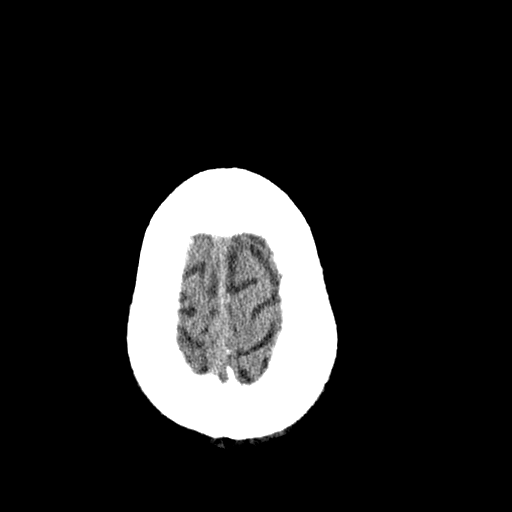
[im 29/33  brain]
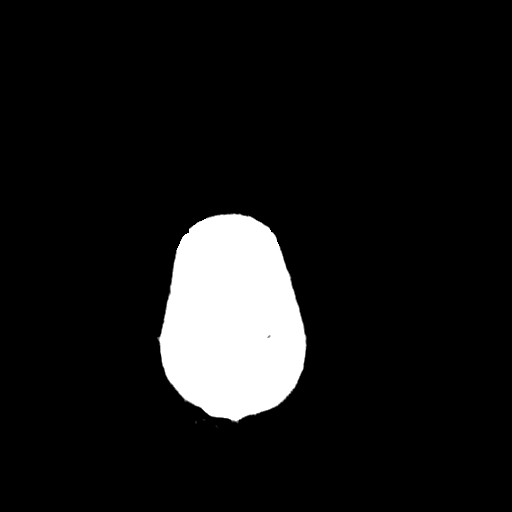
[im 31/33  brain]
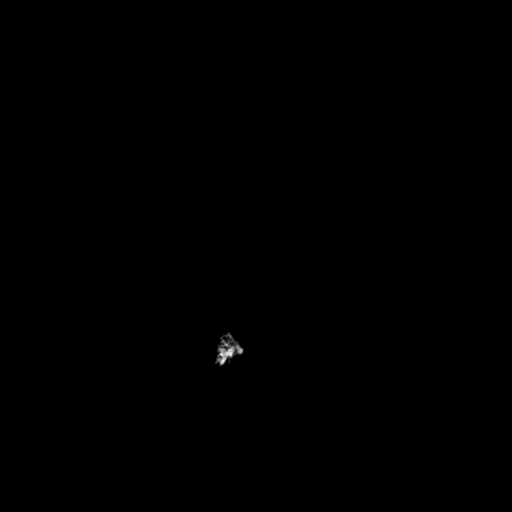

[16 of 30 positions shown; findings below may reference images not displayed]

FINDINGS: Minimal posterior scalp soft tissue swelling, consistent with
contusion. Scalp soft tissues otherwise unremarkable. No acute
abnormality about the orbits.

Small retention cyst within the left maxillary sinus. Paranasal
sinuses are otherwise clear. No mastoid effusion.

Calvarium intact.

Generalized cerebral atrophy with mild chronic small vessel ischemic
disease. No acute intracranial hemorrhage. No acute large vessel
territory infarct. No mass lesion, midline shift, or mass effect. No
hydrocephalus. No extra-axial fluid collection.
IMPRESSION: 1. No acute intracranial process.
2. Small left posterior scalp contusion.
3. Mild atrophy with chronic small vessel ischemic disease.

## 2023-02-21 DEATH — deceased
